# Patient Record
Sex: Female | Born: 1945 | Race: Asian | Hispanic: No
Health system: Southern US, Community
[De-identification: ages and names within clinical notes are randomized; demographics above are authoritative.]

---

## 2006-03-26 ENCOUNTER — Ambulatory Visit: Payer: Self-pay | Admitting: Family Medicine

## 2006-04-02 ENCOUNTER — Ambulatory Visit (HOSPITAL_COMMUNITY): Admission: RE | Admit: 2006-04-02 | Discharge: 2006-04-02 | Payer: Self-pay | Admitting: Family Medicine

## 2006-04-11 ENCOUNTER — Ambulatory Visit (HOSPITAL_COMMUNITY): Admission: RE | Admit: 2006-04-11 | Discharge: 2006-04-11 | Payer: Self-pay | Admitting: Internal Medicine

## 2006-04-21 ENCOUNTER — Encounter: Admission: RE | Admit: 2006-04-21 | Discharge: 2006-04-21 | Payer: Self-pay | Admitting: Internal Medicine

## 2006-07-21 ENCOUNTER — Ambulatory Visit: Payer: Self-pay | Admitting: Family Medicine

## 2006-07-22 ENCOUNTER — Ambulatory Visit: Payer: Self-pay | Admitting: *Deleted

## 2006-08-25 ENCOUNTER — Ambulatory Visit: Payer: Self-pay | Admitting: Family Medicine

## 2006-09-01 ENCOUNTER — Ambulatory Visit: Payer: Self-pay | Admitting: Family Medicine

## 2006-09-05 ENCOUNTER — Ambulatory Visit: Payer: Self-pay | Admitting: Family Medicine

## 2006-09-08 ENCOUNTER — Ambulatory Visit: Payer: Self-pay | Admitting: Nurse Practitioner

## 2006-09-29 ENCOUNTER — Ambulatory Visit: Payer: Self-pay | Admitting: Nurse Practitioner

## 2006-10-30 ENCOUNTER — Encounter: Admission: RE | Admit: 2006-10-30 | Discharge: 2006-10-30 | Payer: Self-pay | Admitting: Family Medicine

## 2006-11-11 ENCOUNTER — Ambulatory Visit: Payer: Self-pay | Admitting: Family Medicine

## 2007-04-08 ENCOUNTER — Ambulatory Visit: Payer: Self-pay | Admitting: Internal Medicine

## 2007-05-06 ENCOUNTER — Ambulatory Visit: Payer: Self-pay | Admitting: Internal Medicine

## 2007-05-07 ENCOUNTER — Encounter (INDEPENDENT_AMBULATORY_CARE_PROVIDER_SITE_OTHER): Payer: Self-pay | Admitting: Family Medicine

## 2007-05-07 DIAGNOSIS — R32 Unspecified urinary incontinence: Secondary | ICD-10-CM | POA: Insufficient documentation

## 2007-05-07 DIAGNOSIS — E785 Hyperlipidemia, unspecified: Secondary | ICD-10-CM

## 2007-07-22 ENCOUNTER — Encounter (INDEPENDENT_AMBULATORY_CARE_PROVIDER_SITE_OTHER): Payer: Self-pay | Admitting: *Deleted

## 2007-10-12 ENCOUNTER — Ambulatory Visit: Payer: Self-pay | Admitting: Internal Medicine

## 2007-10-12 ENCOUNTER — Encounter (INDEPENDENT_AMBULATORY_CARE_PROVIDER_SITE_OTHER): Payer: Self-pay | Admitting: Family Medicine

## 2007-10-12 LAB — CONVERTED CEMR LAB
AST: 26 units/L (ref 0–37)
Alkaline Phosphatase: 126 units/L — ABNORMAL HIGH (ref 39–117)
BUN: 10 mg/dL (ref 6–23)
CO2: 25 meq/L (ref 19–32)
Cholesterol: 158 mg/dL (ref 0–200)
Creatinine, Ser: 0.55 mg/dL (ref 0.40–1.20)
Glucose, Bld: 83 mg/dL (ref 70–99)
LDL Cholesterol: 76 mg/dL (ref 0–99)
Potassium: 4.5 meq/L (ref 3.5–5.3)
Sodium: 145 meq/L (ref 135–145)
Total Bilirubin: 0.4 mg/dL (ref 0.3–1.2)
Total CHOL/HDL Ratio: 3
VLDL: 29 mg/dL (ref 0–40)

## 2008-12-16 ENCOUNTER — Ambulatory Visit: Payer: Self-pay | Admitting: Internal Medicine

## 2009-03-13 ENCOUNTER — Other Ambulatory Visit: Admission: RE | Admit: 2009-03-13 | Discharge: 2009-03-13 | Payer: Self-pay | Admitting: Family Medicine

## 2009-03-14 ENCOUNTER — Encounter: Admission: RE | Admit: 2009-03-14 | Discharge: 2009-03-14 | Payer: Self-pay | Admitting: Family Medicine

## 2010-11-25 ENCOUNTER — Encounter: Payer: Self-pay | Admitting: Internal Medicine

## 2010-11-28 ENCOUNTER — Other Ambulatory Visit: Payer: Self-pay | Admitting: Internal Medicine

## 2010-11-28 DIAGNOSIS — Z780 Asymptomatic menopausal state: Secondary | ICD-10-CM

## 2010-11-28 DIAGNOSIS — Z1239 Encounter for other screening for malignant neoplasm of breast: Secondary | ICD-10-CM

## 2010-12-10 ENCOUNTER — Ambulatory Visit
Admission: RE | Admit: 2010-12-10 | Discharge: 2010-12-10 | Disposition: A | Discharge status: Home / Self Care | Payer: No Typology Code available for payment source | Source: Ambulatory Visit | Attending: Internal Medicine | Admitting: Internal Medicine

## 2010-12-10 DIAGNOSIS — Z780 Asymptomatic menopausal state: Secondary | ICD-10-CM

## 2010-12-10 DIAGNOSIS — Z1239 Encounter for other screening for malignant neoplasm of breast: Secondary | ICD-10-CM

## 2012-05-27 ENCOUNTER — Other Ambulatory Visit: Payer: Self-pay | Admitting: Family Medicine

## 2012-05-27 ENCOUNTER — Other Ambulatory Visit: Payer: Self-pay | Admitting: Internal Medicine

## 2012-05-27 DIAGNOSIS — Z1231 Encounter for screening mammogram for malignant neoplasm of breast: Secondary | ICD-10-CM

## 2012-06-15 ENCOUNTER — Ambulatory Visit
Admission: RE | Admit: 2012-06-15 | Discharge: 2012-06-15 | Disposition: A | Discharge status: Home / Self Care | Payer: Medicare Other | Source: Ambulatory Visit | Attending: Family Medicine | Admitting: Family Medicine

## 2012-06-15 DIAGNOSIS — Z1231 Encounter for screening mammogram for malignant neoplasm of breast: Secondary | ICD-10-CM

## 2012-09-24 ENCOUNTER — Other Ambulatory Visit: Payer: Self-pay | Admitting: Family Medicine

## 2012-09-24 DIAGNOSIS — Z780 Asymptomatic menopausal state: Secondary | ICD-10-CM

## 2012-09-24 DIAGNOSIS — Z1231 Encounter for screening mammogram for malignant neoplasm of breast: Secondary | ICD-10-CM

## 2014-12-19 ENCOUNTER — Other Ambulatory Visit: Payer: Self-pay

## 2015-05-25 ENCOUNTER — Other Ambulatory Visit: Payer: Self-pay | Admitting: Family Medicine

## 2015-05-25 DIAGNOSIS — Z1231 Encounter for screening mammogram for malignant neoplasm of breast: Secondary | ICD-10-CM

## 2015-05-25 DIAGNOSIS — M8580 Other specified disorders of bone density and structure, unspecified site: Secondary | ICD-10-CM

## 2015-05-30 ENCOUNTER — Ambulatory Visit
Admission: RE | Admit: 2015-05-30 | Discharge: 2015-05-30 | Disposition: A | Discharge status: Home / Self Care | Payer: Medicare Other | Source: Ambulatory Visit | Attending: Family Medicine | Admitting: Family Medicine

## 2015-05-30 DIAGNOSIS — Z1231 Encounter for screening mammogram for malignant neoplasm of breast: Secondary | ICD-10-CM

## 2016-06-25 ENCOUNTER — Ambulatory Visit (HOSPITAL_COMMUNITY)
Admission: EM | Admit: 2016-06-25 | Discharge: 2016-06-25 | Discharge status: Absent without leave | Payer: Medicare Other

## 2017-10-07 ENCOUNTER — Other Ambulatory Visit: Payer: Self-pay | Admitting: Family Medicine

## 2017-10-07 DIAGNOSIS — Z1231 Encounter for screening mammogram for malignant neoplasm of breast: Secondary | ICD-10-CM

## 2017-10-10 ENCOUNTER — Ambulatory Visit
Admission: RE | Admit: 2017-10-10 | Discharge: 2017-10-10 | Disposition: A | Discharge status: Home / Self Care | Payer: Medicare Other | Source: Ambulatory Visit | Attending: Family Medicine | Admitting: Family Medicine

## 2017-10-10 DIAGNOSIS — Z1231 Encounter for screening mammogram for malignant neoplasm of breast: Secondary | ICD-10-CM

## 2017-11-05 ENCOUNTER — Ambulatory Visit: Payer: Medicare Other

## 2018-12-15 ENCOUNTER — Other Ambulatory Visit: Payer: Self-pay | Admitting: Family Medicine

## 2018-12-15 DIAGNOSIS — M8580 Other specified disorders of bone density and structure, unspecified site: Secondary | ICD-10-CM

## 2018-12-15 DIAGNOSIS — Z1231 Encounter for screening mammogram for malignant neoplasm of breast: Secondary | ICD-10-CM

## 2019-02-16 ENCOUNTER — Ambulatory Visit: Payer: Medicare Other

## 2019-02-16 ENCOUNTER — Other Ambulatory Visit: Payer: Medicare Other

## 2019-05-18 ENCOUNTER — Other Ambulatory Visit: Payer: Self-pay

## 2019-05-18 ENCOUNTER — Ambulatory Visit
Admission: RE | Admit: 2019-05-18 | Discharge: 2019-05-18 | Disposition: A | Discharge status: Home / Self Care | Payer: Medicare Other | Source: Ambulatory Visit | Attending: Family Medicine | Admitting: Family Medicine

## 2019-05-18 DIAGNOSIS — M8580 Other specified disorders of bone density and structure, unspecified site: Secondary | ICD-10-CM

## 2019-05-18 DIAGNOSIS — Z1231 Encounter for screening mammogram for malignant neoplasm of breast: Secondary | ICD-10-CM

## 2020-12-15 DIAGNOSIS — H524 Presbyopia: Secondary | ICD-10-CM | POA: Diagnosis not present

## 2020-12-15 DIAGNOSIS — H04123 Dry eye syndrome of bilateral lacrimal glands: Secondary | ICD-10-CM | POA: Diagnosis not present

## 2020-12-15 DIAGNOSIS — H2513 Age-related nuclear cataract, bilateral: Secondary | ICD-10-CM | POA: Diagnosis not present

## 2020-12-19 ENCOUNTER — Other Ambulatory Visit: Payer: Self-pay | Admitting: Internal Medicine

## 2020-12-19 DIAGNOSIS — K2900 Acute gastritis without bleeding: Secondary | ICD-10-CM | POA: Diagnosis not present

## 2020-12-19 DIAGNOSIS — E559 Vitamin D deficiency, unspecified: Secondary | ICD-10-CM | POA: Diagnosis not present

## 2020-12-19 DIAGNOSIS — Z79899 Other long term (current) drug therapy: Secondary | ICD-10-CM | POA: Diagnosis not present

## 2020-12-19 DIAGNOSIS — Z0000 Encounter for general adult medical examination without abnormal findings: Secondary | ICD-10-CM | POA: Diagnosis not present

## 2020-12-19 DIAGNOSIS — Z7189 Other specified counseling: Secondary | ICD-10-CM | POA: Diagnosis not present

## 2020-12-19 DIAGNOSIS — Z1231 Encounter for screening mammogram for malignant neoplasm of breast: Secondary | ICD-10-CM

## 2020-12-19 DIAGNOSIS — M8580 Other specified disorders of bone density and structure, unspecified site: Secondary | ICD-10-CM

## 2020-12-19 DIAGNOSIS — E785 Hyperlipidemia, unspecified: Secondary | ICD-10-CM | POA: Diagnosis not present

## 2020-12-19 DIAGNOSIS — Z1389 Encounter for screening for other disorder: Secondary | ICD-10-CM | POA: Diagnosis not present

## 2021-05-21 ENCOUNTER — Ambulatory Visit
Admission: RE | Admit: 2021-05-21 | Discharge: 2021-05-21 | Disposition: A | Discharge status: Home / Self Care | Payer: Medicare Other | Source: Ambulatory Visit | Attending: Internal Medicine | Admitting: Internal Medicine

## 2021-05-21 ENCOUNTER — Other Ambulatory Visit: Payer: Self-pay

## 2021-05-21 DIAGNOSIS — M8588 Other specified disorders of bone density and structure, other site: Secondary | ICD-10-CM | POA: Diagnosis not present

## 2021-05-21 DIAGNOSIS — Z780 Asymptomatic menopausal state: Secondary | ICD-10-CM | POA: Diagnosis not present

## 2021-05-21 DIAGNOSIS — Z1231 Encounter for screening mammogram for malignant neoplasm of breast: Secondary | ICD-10-CM

## 2021-05-21 DIAGNOSIS — M8580 Other specified disorders of bone density and structure, unspecified site: Secondary | ICD-10-CM

## 2021-05-21 DIAGNOSIS — M810 Age-related osteoporosis without current pathological fracture: Secondary | ICD-10-CM | POA: Diagnosis not present

## 2021-05-24 DIAGNOSIS — M810 Age-related osteoporosis without current pathological fracture: Secondary | ICD-10-CM | POA: Diagnosis not present

## 2021-05-24 DIAGNOSIS — Z0000 Encounter for general adult medical examination without abnormal findings: Secondary | ICD-10-CM | POA: Diagnosis not present

## 2021-12-18 DIAGNOSIS — H524 Presbyopia: Secondary | ICD-10-CM | POA: Diagnosis not present

## 2021-12-18 DIAGNOSIS — H2513 Age-related nuclear cataract, bilateral: Secondary | ICD-10-CM | POA: Diagnosis not present

## 2021-12-26 DIAGNOSIS — M21612 Bunion of left foot: Secondary | ICD-10-CM | POA: Diagnosis not present

## 2021-12-26 DIAGNOSIS — Z79899 Other long term (current) drug therapy: Secondary | ICD-10-CM | POA: Diagnosis not present

## 2021-12-26 DIAGNOSIS — Z1389 Encounter for screening for other disorder: Secondary | ICD-10-CM | POA: Diagnosis not present

## 2021-12-26 DIAGNOSIS — M810 Age-related osteoporosis without current pathological fracture: Secondary | ICD-10-CM | POA: Diagnosis not present

## 2021-12-26 DIAGNOSIS — Z0000 Encounter for general adult medical examination without abnormal findings: Secondary | ICD-10-CM | POA: Diagnosis not present

## 2021-12-26 DIAGNOSIS — N3281 Overactive bladder: Secondary | ICD-10-CM | POA: Diagnosis not present

## 2021-12-26 DIAGNOSIS — E559 Vitamin D deficiency, unspecified: Secondary | ICD-10-CM | POA: Diagnosis not present

## 2021-12-26 DIAGNOSIS — E785 Hyperlipidemia, unspecified: Secondary | ICD-10-CM | POA: Diagnosis not present

## 2022-02-01 DIAGNOSIS — R739 Hyperglycemia, unspecified: Secondary | ICD-10-CM | POA: Diagnosis not present

## 2022-02-01 DIAGNOSIS — N3281 Overactive bladder: Secondary | ICD-10-CM | POA: Diagnosis not present

## 2022-02-01 DIAGNOSIS — R7309 Other abnormal glucose: Secondary | ICD-10-CM | POA: Diagnosis not present

## 2022-04-04 DIAGNOSIS — E441 Mild protein-calorie malnutrition: Secondary | ICD-10-CM | POA: Diagnosis not present

## 2022-04-04 DIAGNOSIS — N3281 Overactive bladder: Secondary | ICD-10-CM | POA: Diagnosis not present

## 2022-04-04 DIAGNOSIS — R634 Abnormal weight loss: Secondary | ICD-10-CM | POA: Diagnosis not present

## 2022-04-04 DIAGNOSIS — H04123 Dry eye syndrome of bilateral lacrimal glands: Secondary | ICD-10-CM | POA: Diagnosis not present

## 2022-05-09 ENCOUNTER — Other Ambulatory Visit: Payer: Self-pay | Admitting: Internal Medicine

## 2022-05-09 DIAGNOSIS — Z1231 Encounter for screening mammogram for malignant neoplasm of breast: Secondary | ICD-10-CM

## 2022-05-21 ENCOUNTER — Ambulatory Visit: Payer: Medicare Other

## 2022-05-22 ENCOUNTER — Ambulatory Visit: Payer: Medicare Other

## 2022-05-23 ENCOUNTER — Ambulatory Visit
Admission: RE | Admit: 2022-05-23 | Discharge: 2022-05-23 | Disposition: A | Discharge status: Home / Self Care | Payer: Medicare Other | Source: Ambulatory Visit | Attending: Internal Medicine | Admitting: Internal Medicine

## 2022-05-23 DIAGNOSIS — Z1231 Encounter for screening mammogram for malignant neoplasm of breast: Secondary | ICD-10-CM

## 2022-06-26 DIAGNOSIS — M810 Age-related osteoporosis without current pathological fracture: Secondary | ICD-10-CM | POA: Diagnosis not present

## 2022-06-26 DIAGNOSIS — N3281 Overactive bladder: Secondary | ICD-10-CM | POA: Diagnosis not present

## 2022-06-26 DIAGNOSIS — E559 Vitamin D deficiency, unspecified: Secondary | ICD-10-CM | POA: Diagnosis not present

## 2022-06-26 DIAGNOSIS — E441 Mild protein-calorie malnutrition: Secondary | ICD-10-CM | POA: Diagnosis not present

## 2022-11-08 ENCOUNTER — Ambulatory Visit: Payer: Medicare Other | Admitting: Orthopedic Surgery

## 2022-11-08 ENCOUNTER — Ambulatory Visit (INDEPENDENT_AMBULATORY_CARE_PROVIDER_SITE_OTHER): Payer: Medicare Other

## 2022-11-08 VITALS — BP 123/74 | HR 59 | Ht 59.0 in | Wt 90.0 lb

## 2022-11-08 DIAGNOSIS — M545 Low back pain, unspecified: Secondary | ICD-10-CM

## 2022-11-08 NOTE — Progress Notes (Signed)
Orthopedic Spine Surgery Office Note  Assessment: Patient is a 77 y.o. female with right sided low back pain, possible SI joint as etiology versus degenerative disc disease   Plan: -Explained that initially conservative treatment is tried as a significant number of patients may experience relief with these treatment modalities. Discussed that the conservative treatments include:  -activity modification  -physical therapy  -over the counter pain medications  -medrol dosepak  -steroid injections -Patient has tried activity modification  -Recommended PT (referral provided), home exercises, and tylenol (650mg  TID) -Patient should return to office in 6 weeks, x-rays at next visit: AP pelvis/inlet/outlet   Patient expressed understanding of the plan and all questions were answered to the patient's satisfaction.   ___________________________________________________________________________   History:  Patient is a 77 y.o. female who presents today for lumbar spine. Patient had right sided low back pain about 15 years ago that is very similar to what she is experiencing now. She said she received one steroid injection at that time and the pain went away. Pain returned about 3 weeks ago. There was no trauma or injury that brought on the pain. Pain is felt over the right lower back near the SI joint. It is worse with she is standing for awhile or walking. Gets better if she rests. She has tried modifying her activities and resting but it has not gotten better. She not tried any other treatments. No pain radiating down the legs. No paresthesias or numbness.    Weakness: denies Symptoms of imbalance: denies Paresthesias and numbness: denies Bowel or bladder incontinence: denies Saddle anesthesia: denies  Treatments tried: activity modification  Review of systems: Denies fevers and chills, night sweats, unexplained weight loss, history of cancer, pain that wakes them at night  Past medical  history: HLD  Allergies: NKDA  Past surgical history:  None  Social history: Denies use of nicotine product (smoking, vaping, patches, smokeless) Alcohol use: denies Denies recreational drug use   Physical Exam:  General: no acute distress, appears stated age Neurologic: alert, answering questions appropriately, following commands Respiratory: unlabored breathing on room air, symmetric chest rise Psychiatric: appropriate affect, normal cadence to speech   MSK (spine):  -Strength exam      Left  Right EHL    5/5  5/5 TA    5/5  5/5 GSC    5/5  5/5 Knee extension  5/5  5/5 Hip flexion   5/5  5/5  -Sensory exam    Sensation intact to light touch in L3-S1 nerve distributions of bilateral lower extremities  -Achilles DTR: 1/4 on the left, 1/4 on the right -Patellar tendon DTR: 1/4 on the left, 1/4 on the right  -Straight leg raise: negative -Contralateral straight leg raise: negative -Clonus: no beats bilaterally  -Left hip exam: no pain through range of motion, negative stinchfield, negative FABER -Right hip exam: no pain through range of motion, negative FABER, negative stinchfield, mild pain with SI joint compression and gaenslen's TTP over the SI joint area  Imaging: XR of the lumbar spine from 11/08/2022 was independently reviewed and interpreted, showing lumbar scoliosis with apex to the right at L3. Disc height loss at L3/4, L4/5, L5/S1. No fracture or dislocation.   Patient name: Gabrielle Beltran Patient MRN: [id number] Date of visit: 11/08/22

## 2022-11-18 ENCOUNTER — Ambulatory Visit: Payer: Medicare Other | Admitting: Physical Therapy

## 2022-11-18 ENCOUNTER — Encounter: Payer: Self-pay | Admitting: Physical Therapy

## 2022-11-18 DIAGNOSIS — R293 Abnormal posture: Secondary | ICD-10-CM

## 2022-11-18 DIAGNOSIS — M6281 Muscle weakness (generalized): Secondary | ICD-10-CM

## 2022-11-18 DIAGNOSIS — M5459 Other low back pain: Secondary | ICD-10-CM

## 2022-11-18 NOTE — Therapy (Signed)
OUTPATIENT PHYSICAL THERAPY THORACOLUMBAR EVALUATION   Patient Name: Gabrielle Beltran MRN: [id number] DOB:11-23-1945, 77 y.o., female Today's Date: 11/18/2022  END OF SESSION:  PT End of Session - 11/18/22 0919     Visit Number 1    Number of Visits 13    Date for PT Re-Evaluation 12/30/22    Authorization Type [employer] MCR    Authorization Time Period 11/18/22 to 01/13/23    Progress Note Due on Visit 10    PT Start Time 0909    PT Stop Time 0949    PT Time Calculation (min) 40 min    Activity Tolerance Patient tolerated treatment well    Behavior During Therapy [facility] for tasks assessed/performed             History reviewed. No pertinent past medical history. History reviewed. No pertinent surgical history. Patient Active Problem List   Diagnosis Date Noted   HYPERLIPIDEMIA 05/07/2007   URINARY INCONTINENCE 05/07/2007    PCP: Kelton Pillar MD   REFERRING PROVIDER: Callie Fielding, MD  REFERRING DIAG: M54.50 (ICD-10-CM) - Low back pain, unspecified back pain laterality, unspecified chronicity, unspecified whether sciatica present  Rationale for Evaluation and Treatment: Rehabilitation  THERAPY DIAG:  Other low back pain  Muscle weakness (generalized)  Abnormal posture  ONSET DATE: 11/08/2022  SUBJECTIVE:                                                                                                                                                                                           SUBJECTIVE STATEMENT:    I've had this pain for a long time, had a lot of pain 15-20 years ago but I took steroid shots for pain management and it was OK. Now the pain is in the same spot, its uncomfortable now. No falls or anything, no injuries recently. Started back about 2 weeks ago, I've been trying creams and pain patches, Tylenol too.   PERTINENT HISTORY:  Orthopedic Spine Surgery Office Note   Assessment: Patient is a 77 y.o. female with right sided low back pain, possible  SI joint as etiology versus degenerative disc disease     Plan: -Explained that initially conservative treatment is tried as a significant number of patients may experience relief with these treatment modalities. Discussed that the conservative treatments include:             -activity modification             -physical therapy             -over the counter pain medications             -  medrol dosepak             -steroid injections -Patient has tried activity modification  -Recommended PT (referral provided), home exercises, and tylenol (650mg  TID) -Patient should return to office in 6 weeks, x-rays at next visit: AP pelvis/inlet/outlet     Patient expressed understanding of the plan and all questions were answered to the patient's satisfaction.    ___________________________________________________________________________     History:   Patient is a 77 y.o. female who presents today for lumbar spine. Patient had right sided low back pain about 15 years ago that is very similar to what she is experiencing now. She said she received one steroid injection at that time and the pain went away. Pain returned about 3 weeks ago. There was no trauma or injury that brought on the pain. Pain is felt over the right lower back near the SI joint. It is worse with she is standing for awhile or walking. Gets better if she rests. She has tried modifying her activities and resting but it has not gotten better. She not tried any other treatments. No pain radiating down the legs. No paresthesias or numbness.       PAIN:  Are you having pain? Yes: NPRS scale: "just uncomfortable today" /10 Pain location: R low back but sometimes L and both sides but R>L Pain description: Uncomfortable Aggravating factors: sitting, slouching  Relieving factors: standing   PRECAUTIONS: None  WEIGHT BEARING RESTRICTIONS: No  FALLS:  Has patient fallen in last 6 months? No  LIVING ENVIRONMENT: Lives with: lives  with their family Lives in: House/apartment Stairs: no steps, ranch style house Has following equipment at home: None  OCCUPATION: home maker  PLOF: Independent, Independent with basic ADLs, Independent with gait, and Independent with transfers  PATIENT GOALS: get rid of pain  NEXT MD VISIT: Dr. 70 12/20/22   OBJECTIVE:   DIAGNOSTIC FINDINGS:    PATIENT SURVEYS:  FOTO 57, predicted 72  SCREENING FOR RED FLAGS: Bowel or bladder incontinence: No Spinal tumors: No Cauda equina syndrome: No Compression fracture: No Abdominal aneurysm: No  COGNITION: Overall cognitive status: Within functional limits for tasks assessed     SENSATION: WFL  MUSCLE LENGTH:  HS mild tightness B Piriformis WNL B   POSTURE: rounded shoulders and forward head  PALPATION: Lumbar parapsinals tight but not TTP  LUMBAR ROM:   AROM eval  Flexion WNL; RFIS small increase in pain    Extension Mild limitation, REIS improvement in pain   Right lateral flexion Mild limitation  Left lateral flexion Moderate limitation   Right rotation Mild limitation   Left rotation Mild limitation    (Blank rows = not tested)  LOWER EXTREMITY ROM:     Active  Right eval Left eval  Hip flexion    Hip extension    Hip abduction    Hip adduction    Hip internal rotation    Hip external rotation    Knee flexion    Knee extension    Ankle dorsiflexion    Ankle plantarflexion    Ankle inversion    Ankle eversion     (Blank rows = not tested)  LOWER EXTREMITY MMT:    MMT Right eval Left eval  Hip flexion 3 3  Hip extension 3 3  Hip abduction 3 3  Hip adduction    Hip internal rotation    Hip external rotation    Knee flexion 4 4-  Knee extension 5 4  Ankle  dorsiflexion 5 5  Ankle plantarflexion    Ankle inversion    Ankle eversion     (Blank rows = not tested)  LUMBAR SPECIAL TESTS:  Straight leg raise test: Negative  FUNCTIONAL TESTS:   R LE slightly shorter than R by about 1/4  inch   GAIT: Distance walked: in clinic distances  Assistive device utilized: None Level of assistance: Complete Independence Comments: + trendelenburg   TODAY'S TREATMENT:                                                                                                                              DATE:   Eval   Objective measures/appropriate education  TherEx  Bridges x10 Lumbar rotation stretch x10 Tried prone press ups, limited by UE strength Standing lumbar extensions x10  Hip hikes x10 B     PATIENT EDUCATION:  Education details: exam findings, POC, HEP  Person educated: Patient Education method: Customer service manager Education comprehension: verbalized understanding and returned demonstration  HOME EXERCISE PROGRAM:   Access Code: [email or web address] URL: [email or web address] Date: 11/18/2022 Prepared by: Deniece Ree  Exercises - Supine Bridge  - 2 x daily - 7 x weekly - 1 sets - 10 reps - 2 hold - Supine Lower Trunk Rotation  - 2 x daily - 7 x weekly - 1 sets - 10 reps - 5 hold - Standing Lumbar Extension  - 2 x daily - 7 x weekly - 1 sets - 10 reps - 1 hold - Standing Hip Hiking  - 2 x daily - 7 x weekly - 1 sets - 10 reps - 2 hold  ASSESSMENT:  CLINICAL IMPRESSION: Patient is a 77 y.o. F who was seen today for physical therapy evaluation and treatment for low back pain. Exam is fairly straight forward and reveals postural impairment, generalized weakness, limited lumbar mobility with mm spasm, poor biomechanics, and impaired mm flexibility. Seems to have an extension preference as well. Will benefit from skilled PT services to address pain and assist in return to optimal level of function.   OBJECTIVE IMPAIRMENTS: decreased ROM, decreased strength, increased fascial restrictions, increased muscle spasms, impaired flexibility, postural dysfunction, and pain.   ACTIVITY LIMITATIONS: bending, sitting, squatting, transfers, and locomotion  level  PARTICIPATION LIMITATIONS: meal prep, cleaning, laundry, driving, shopping, community activity, and yard work  PERSONAL FACTORS: Age, Behavior pattern, Fitness, Past/current experiences, Social background, and Time since onset of injury/illness/exacerbation are also affecting patient's functional outcome.   REHAB POTENTIAL: Good  CLINICAL DECISION MAKING: Stable/uncomplicated  EVALUATION COMPLEXITY: Low   GOALS: Goals reviewed with patient? Yes  SHORT TERM GOALS: Target date: 12/09/2022    Will be compliant with appropriate progressive HEP  Baseline: Goal status: INITIAL  2.  Will demonstrate better awareness of general posture as well as correct biomechanics for supine<->sit transfers and floor to waist heigh lifting  Baseline:  Goal status: INITIAL  3.  Lumbar ROM to have normalized with no limitations  Baseline:  Goal status: INITIAL  4.  Flexibility impairments to have resolved  Baseline:  Goal status: INITIAL   LONG TERM GOALS: Target date: 12/30/2022    MMT to have improved by at least 1 grade in all weak muscles  Baseline:  Goal status: INITIAL  2.  Pain to be no more than 1/10 at worst/frequency of pain to have improved by at least 75%  Baseline:  Goal status: INITIAL  3.  FOTO score to have improved by at least 10 points to show subjective functional improvement  Baseline:  Goal status: INITIAL  4.  Will be compliant in appropriate gym based exercise program to maintain functional gains from PT/prevent recurrence of pain  Baseline:  Goal status: INITIAL   PLAN:  PT FREQUENCY: 2x/week  PT DURATION: 6 weeks  PLANNED INTERVENTIONS: Therapeutic exercises, Therapeutic activity, Neuromuscular re-education, Balance training, Gait training, Patient/Family education, Self Care, Joint mobilization, Aquatic Therapy, Dry Needling, Electrical stimulation, Spinal mobilization, Cryotherapy, Moist heat, Taping, Ultrasound, Ionotophoresis 4mg /ml  Dexamethasone, Manual therapy, and Re-evaluation.  PLAN FOR NEXT SESSION: hip and core strength, focus on extension based program, biomechanics  PT DPT PN2

## 2022-11-20 ENCOUNTER — Ambulatory Visit: Payer: Medicare Other | Admitting: Physical Therapy

## 2022-11-20 ENCOUNTER — Encounter: Payer: Self-pay | Admitting: Physical Therapy

## 2022-11-20 DIAGNOSIS — R293 Abnormal posture: Secondary | ICD-10-CM

## 2022-11-20 DIAGNOSIS — M5459 Other low back pain: Secondary | ICD-10-CM

## 2022-11-20 DIAGNOSIS — M6281 Muscle weakness (generalized): Secondary | ICD-10-CM

## 2022-11-20 NOTE — Therapy (Signed)
OUTPATIENT PHYSICAL THERAPY TREATMENT NOTE   Patient Name: Gabrielle Beltran MRN: [id number] DOB:1946-08-17, 77 y.o., female Today's Date: 11/20/2022  END OF SESSION:   PT End of Session - 11/20/22 0841     Visit Number 2    Number of Visits 13    Date for PT Re-Evaluation 12/30/22    Authorization Type [employer] MCR    Authorization Time Period 11/18/22 to 01/13/23    Progress Note Due on Visit 10    PT Start Time 0845    PT Stop Time 0926    PT Time Calculation (min) 41 min    Activity Tolerance Patient tolerated treatment well    Behavior During Therapy [facility] for tasks assessed/performed             History reviewed. No pertinent past medical history. History reviewed. No pertinent surgical history. Patient Active Problem List   Diagnosis Date Noted   HYPERLIPIDEMIA 05/07/2007   URINARY INCONTINENCE 05/07/2007     THERAPY DIAG:  Other low back pain  Muscle weakness (generalized)  Abnormal posture   PCP: Maurice Small MD   REFERRING PROVIDER: London Sheer, MD  REFERRING DIAG: M54.50 (ICD-10-CM) - Low back pain, unspecified back pain laterality, unspecified chronicity, unspecified whether sciatica present  Rationale for Evaluation and Treatment: Rehabilitation  EVAL THERAPY DIAG:  Other low back pain  Muscle weakness (generalized)  Abnormal posture  ONSET DATE: 11/08/2022  SUBJECTIVE:                                                                                                                                                                                           SUBJECTIVE STATEMENT: Rt side back is uncomfortable today; has done her exercises twice   PERTINENT HISTORY:  Orthopedic Spine Surgery Office Note   Assessment: Patient is a 77 y.o. female with right sided low back pain, possible SI joint as etiology versus degenerative disc disease     Plan: -Explained that initially conservative treatment is tried as a significant number of patients  may experience relief with these treatment modalities. Discussed that the conservative treatments include:             -activity modification             -physical therapy             -over the counter pain medications             -medrol dosepak             -steroid injections -Patient has tried activity modification  -Recommended PT (referral provided), home exercises, and tylenol (650mg  TID) -Patient  should return to office in 6 weeks, x-rays at next visit: AP pelvis/inlet/outlet     Patient expressed understanding of the plan and all questions were answered to the patient's satisfaction.    ___________________________________________________________________________     History:   Patient is a 77 y.o. female who presents today for lumbar spine. Patient had right sided low back pain about 15 years ago that is very similar to what she is experiencing now. She said she received one steroid injection at that time and the pain went away. Pain returned about 3 weeks ago. There was no trauma or injury that brought on the pain. Pain is felt over the right lower back near the SI joint. It is worse with she is standing for awhile or walking. Gets better if she rests. She has tried modifying her activities and resting but it has not gotten better. She not tried any other treatments. No pain radiating down the legs. No paresthesias or numbness.       PAIN:  Are you having pain? Yes: NPRS scale: "just uncomfortable today" 6-7/10 Pain location: R low back but sometimes L and both sides but R>L Pain description: Uncomfortable Aggravating factors: sitting, slouching  Relieving factors: standing   PRECAUTIONS: None  WEIGHT BEARING RESTRICTIONS: No  FALLS:  Has patient fallen in last 6 months? No  LIVING ENVIRONMENT: Lives with: lives with their family Lives in: House/apartment Stairs: no steps, ranch style house Has following equipment at home: None  OCCUPATION: home maker  PLOF:  Independent, Independent with basic ADLs, Independent with gait, and Independent with transfers  PATIENT GOALS: get rid of pain  NEXT MD VISIT: Dr. Laurance Flatten 12/20/22   OBJECTIVE:   PATIENT SURVEYS:  EVAL: FOTO 57, predicted 72  MUSCLE LENGTH: EVAL: HS mild tightness B Piriformis WNL B   POSTURE: rounded shoulders and forward head  PALPATION: EVAL: Lumbar parapsinals tight but not TTP  LUMBAR ROM:   AROM eval  Flexion WNL; RFIS small increase in pain    Extension Mild limitation, REIS improvement in pain   Right lateral flexion Mild limitation  Left lateral flexion Moderate limitation   Right rotation Mild limitation   Left rotation Mild limitation    (Blank rows = not tested)   LOWER EXTREMITY MMT:    MMT Right eval Left eval  Hip flexion 3 3  Hip extension 3 3  Hip abduction 3 3  Hip adduction    Hip internal rotation    Hip external rotation    Knee flexion 4 4-  Knee extension 5 4  Ankle dorsiflexion 5 5  Ankle plantarflexion    Ankle inversion    Ankle eversion     (Blank rows = not tested)  LUMBAR SPECIAL TESTS:  EVAL: Straight leg raise test: Negative  FUNCTIONAL TESTS:  EVAL: LLE slightly shorter than R by about 1/4 inch   GAIT: Distance walked: in clinic distances  Assistive device utilized: None Level of assistance: Complete Independence Comments: + trendelenburg   TODAY'S TREATMENT:  DATE:  11/20/22 TherEx NuStep L5 x 6 min Standing lumbar extension x10 reps; 1-2 sec hold Standing hip hike x 10 reps bil; 2 sec hold Bridges x 10 reps; 2 sec hold LTR x 10 reps bil; 5 sec hold Single limb clamshell in hooklying x10 reps bil; L4 band Single knee to chest 3x15 sec bil Supine piriformis stretch (knee to opp shoulder) 3x15 sec bil Prone hip extension x10 reps bil; 5 sec hold Scapular retraction 10 x 5 sec hold; L3  band   Eval   Objective measures/appropriate education  TherEx  Bridges x10 Lumbar rotation stretch x10 Tried prone press ups, limited by UE strength Standing lumbar extensions x10  Hip hikes x10 B     PATIENT EDUCATION:  Education details: exam findings, POC, HEP  Person educated: Patient Education method: Customer service manager Education comprehension: verbalized understanding and returned demonstration  HOME EXERCISE PROGRAM:   Access Code: [email or web address] URL: [email or web address] Date: 11/18/2022 Prepared by: Deniece Ree  Exercises - Supine Bridge  - 2 x daily - 7 x weekly - 1 sets - 10 reps - 2 hold - Supine Lower Trunk Rotation  - 2 x daily - 7 x weekly - 1 sets - 10 reps - 5 hold - Standing Lumbar Extension  - 2 x daily - 7 x weekly - 1 sets - 10 reps - 1 hold - Standing Hip Hiking  - 2 x daily - 7 x weekly - 1 sets - 10 reps - 2 hold  ASSESSMENT:  CLINICAL IMPRESSION: Session today focused on review of HEP with min cues needed; as well as continued strengthening.  Will continue to benefit from PT to maximize function.  OBJECTIVE IMPAIRMENTS: decreased ROM, decreased strength, increased fascial restrictions, increased muscle spasms, impaired flexibility, postural dysfunction, and pain.   ACTIVITY LIMITATIONS: bending, sitting, squatting, transfers, and locomotion level  PARTICIPATION LIMITATIONS: meal prep, cleaning, laundry, driving, shopping, community activity, and yard work  PERSONAL FACTORS: Age, Behavior pattern, Fitness, Past/current experiences, Social background, and Time since onset of injury/illness/exacerbation are also affecting patient's functional outcome.   REHAB POTENTIAL: Good  CLINICAL DECISION MAKING: Stable/uncomplicated  EVALUATION COMPLEXITY: Low   GOALS: Goals reviewed with patient? Yes  SHORT TERM GOALS: Target date: 12/09/2022    Will be compliant with appropriate progressive HEP  Baseline: Goal status:  INITIAL  2.  Will demonstrate better awareness of general posture as well as correct biomechanics for supine<->sit transfers and floor to waist heigh lifting  Baseline:  Goal status: INITIAL  3.  Lumbar ROM to have normalized with no limitations  Baseline:  Goal status: INITIAL  4.  Flexibility impairments to have resolved  Baseline:  Goal status: INITIAL   LONG TERM GOALS: Target date: 12/30/2022    MMT to have improved by at least 1 grade in all weak muscles  Baseline:  Goal status: INITIAL  2.  Pain to be no more than 1/10 at worst/frequency of pain to have improved by at least 75%  Baseline:  Goal status: INITIAL  3.  FOTO score to have improved by at least 10 points to show subjective functional improvement  Baseline:  Goal status: INITIAL  4.  Will be compliant in appropriate gym based exercise program to maintain functional gains from PT/prevent recurrence of pain  Baseline:  Goal status: INITIAL   PLAN:  PT FREQUENCY: 2x/week  PT DURATION: 6 weeks  PLANNED INTERVENTIONS: Therapeutic exercises, Therapeutic activity, Neuromuscular re-education, Balance training, Gait training, Patient/Family education, Self Care, Joint  mobilization, Aquatic Therapy, Dry Needling, Electrical stimulation, Spinal mobilization, Cryotherapy, Moist heat, Taping, Ultrasound, Ionotophoresis 4mg /ml Dexamethasone, Manual therapy, and Re-evaluation.  PLAN FOR NEXT SESSION: hip and core strength, focus on extension based program,   , PT, DPT 11/20/22 9:29 AM

## 2022-11-25 ENCOUNTER — Encounter: Payer: Self-pay | Admitting: Physical Therapy

## 2022-11-25 ENCOUNTER — Ambulatory Visit: Payer: Medicare Other | Admitting: Physical Therapy

## 2022-11-25 DIAGNOSIS — M6281 Muscle weakness (generalized): Secondary | ICD-10-CM | POA: Diagnosis not present

## 2022-11-25 DIAGNOSIS — R293 Abnormal posture: Secondary | ICD-10-CM

## 2022-11-25 DIAGNOSIS — M5459 Other low back pain: Secondary | ICD-10-CM | POA: Diagnosis not present

## 2022-11-25 NOTE — Therapy (Signed)
OUTPATIENT PHYSICAL THERAPY TREATMENT NOTE   Patient Name: Gabrielle Beltran MRN: [id number] DOB:16-Feb-1946, 77 y.o., female Today's Date: 11/25/2022  END OF SESSION:   PT End of Session - 11/25/22 0849     Visit Number 3    Number of Visits 13    Date for PT Re-Evaluation 12/30/22    Authorization Type [employer] MCR    Authorization Time Period 11/18/22 to 01/13/23    Progress Note Due on Visit 10    PT Start Time 0845    PT Stop Time 0924    PT Time Calculation (min) 39 min    Activity Tolerance Patient tolerated treatment well    Behavior During Therapy [facility] for tasks assessed/performed              History reviewed. No pertinent past medical history. History reviewed. No pertinent surgical history. Patient Active Problem List   Diagnosis Date Noted   HYPERLIPIDEMIA 05/07/2007   URINARY INCONTINENCE 05/07/2007     THERAPY DIAG:  Other low back pain  Muscle weakness (generalized)  Abnormal posture   PCP: Maurice Small MD   REFERRING PROVIDER: London Sheer, MD  REFERRING DIAG: M54.50 (ICD-10-CM) - Low back pain, unspecified back pain laterality, unspecified chronicity, unspecified whether sciatica present  Rationale for Evaluation and Treatment: Rehabilitation  EVAL THERAPY DIAG:  Other low back pain  Muscle weakness (generalized)  Abnormal posture  ONSET DATE: 11/08/2022  SUBJECTIVE:                                                                                                                                                                                           SUBJECTIVE STATEMENT: Pain is the same, exercises don't seem to be help; pain relief cream is the only thing that helps  PERTINENT HISTORY:  Orthopedic Spine Surgery Office Note   Assessment: Patient is a 77 y.o. female with right sided low back pain, possible SI joint as etiology versus degenerative disc disease     Plan: -Explained that initially conservative treatment is tried as a  significant number of patients may experience relief with these treatment modalities. Discussed that the conservative treatments include:             -activity modification             -physical therapy             -over the counter pain medications             -medrol dosepak             -steroid injections -Patient has tried activity modification  -Recommended PT (referral  provided), home exercises, and tylenol (650mg  TID) -Patient should return to office in 6 weeks, x-rays at next visit: AP pelvis/inlet/outlet     History:   Patient is a 77 y.o. female who presents today for lumbar spine. Patient had right sided low back pain about 15 years ago that is very similar to what she is experiencing now. She said she received one steroid injection at that time and the pain went away. Pain returned about 3 weeks ago. There was no trauma or injury that brought on the pain. Pain is felt over the right lower back near the SI joint. It is worse with she is standing for awhile or walking. Gets better if she rests. She has tried modifying her activities and resting but it has not gotten better. She not tried any other treatments. No pain radiating down the legs. No paresthesias or numbness.       PAIN:  Are you having pain? Yes: NPRS scale: "just uncomfortable today" 6-7/10 Pain location: R low back but sometimes L and both sides but R>L Pain description: Uncomfortable Aggravating factors: sitting, slouching  Relieving factors: standing   PRECAUTIONS: None  WEIGHT BEARING RESTRICTIONS: No  FALLS:  Has patient fallen in last 6 months? No  LIVING ENVIRONMENT: Lives with: lives with their family Lives in: House/apartment Stairs: no steps, ranch style house Has following equipment at home: None  OCCUPATION: home maker  PLOF: Independent, Independent with basic ADLs, Independent with gait, and Independent with transfers  PATIENT GOALS: get rid of pain  NEXT MD VISIT: Dr. Laurance Flatten 12/20/22    OBJECTIVE:   PATIENT SURVEYS:  EVAL: FOTO 57, predicted 72  MUSCLE LENGTH: EVAL: HS mild tightness B Piriformis WNL B   POSTURE: rounded shoulders and forward head  PALPATION: EVAL: Lumbar parapsinals tight but not TTP  LUMBAR ROM:   AROM eval  Flexion WNL; RFIS small increase in pain    Extension Mild limitation, REIS improvement in pain   Right lateral flexion Mild limitation  Left lateral flexion Moderate limitation   Right rotation Mild limitation   Left rotation Mild limitation    (Blank rows = not tested)   LOWER EXTREMITY MMT:    MMT Right eval Left eval  Hip flexion 3 3  Hip extension 3 3  Hip abduction 3 3  Hip adduction    Hip internal rotation    Hip external rotation    Knee flexion 4 4-  Knee extension 5 4  Ankle dorsiflexion 5 5  Ankle plantarflexion    Ankle inversion    Ankle eversion     (Blank rows = not tested)  LUMBAR SPECIAL TESTS:  EVAL: Straight leg raise test: Negative  FUNCTIONAL TESTS:  EVAL: LLE slightly shorter than R by about 1/4 inch   GAIT: Distance walked: in clinic distances  Assistive device utilized: None Level of assistance: Complete Independence Comments: + trendelenburg   TODAY'S TREATMENT:  DATE:  11/25/22 TherEx NuStep L6 x 6 min Supine single knee to chest 3x15 sec bil Supine piriformis stretch (knee to opp shoulder) 3x15 sec bil Isometric hip abduction/er with belt 15 reps x 5 sec hold Bridges with strap x 15 reps; 5 sec hold   Modalities IFC to Rt low back/SIJ area x 15 min with supine exercises noted above  TherAct Trial of heel lift and insert; pt did not like either in current shoe but unable to get lift below insole.  Still recommended trial of these at home to see if it helps her pain.  Amb within clinic with both.  11/20/22 TherEx NuStep L5 x 6 min Standing lumbar  extension x10 reps; 1-2 sec hold Standing hip hike x 10 reps bil; 2 sec hold Bridges x 10 reps; 2 sec hold LTR x 10 reps bil; 5 sec hold Single limb clamshell in hooklying x10 reps bil; L4 band Single knee to chest 3x15 sec bil Supine piriformis stretch (knee to opp shoulder) 3x15 sec bil Prone hip extension x10 reps bil; 5 sec hold Scapular retraction 10 x 5 sec hold; L3 band   Eval   Objective measures/appropriate education  TherEx  Bridges x10 Lumbar rotation stretch x10 Tried prone press ups, limited by UE strength Standing lumbar extensions x10  Hip hikes x10 B     PATIENT EDUCATION:  Education details: exam findings, POC, HEP  Person educated: Patient Education method: Medical illustrator Education comprehension: verbalized understanding and returned demonstration  HOME EXERCISE PROGRAM:   Access Code: [email or web address] URL: [email or web address] Date: 11/18/2022 Prepared by: Nedra Hai  Exercises - Supine Bridge  - 2 x daily - 7 x weekly - 1 sets - 10 reps - 2 hold - Supine Lower Trunk Rotation  - 2 x daily - 7 x weekly - 1 sets - 10 reps - 5 hold - Standing Lumbar Extension  - 2 x daily - 7 x weekly - 1 sets - 10 reps - 1 hold - Standing Hip Hiking  - 2 x daily - 7 x weekly - 1 sets - 10 reps - 2 hold  ASSESSMENT:  CLINICAL IMPRESSION: Pt reports no pain after session today and hopefully IFC beneficial with exercise to help pain.  Will continue to benefit from PT to maximize function.  OBJECTIVE IMPAIRMENTS: decreased ROM, decreased strength, increased fascial restrictions, increased muscle spasms, impaired flexibility, postural dysfunction, and pain.   ACTIVITY LIMITATIONS: bending, sitting, squatting, transfers, and locomotion level  PARTICIPATION LIMITATIONS: meal prep, cleaning, laundry, driving, shopping, community activity, and yard work  PERSONAL FACTORS: Age, Behavior pattern, Fitness, Past/current experiences, Social  background, and Time since onset of injury/illness/exacerbation are also affecting patient's functional outcome.   REHAB POTENTIAL: Good  CLINICAL DECISION MAKING: Stable/uncomplicated  EVALUATION COMPLEXITY: Low   GOALS: Goals reviewed with patient? Yes  SHORT TERM GOALS: Target date: 12/09/2022    Will be compliant with appropriate progressive HEP  Baseline: Goal status: INITIAL  2.  Will demonstrate better awareness of general posture as well as correct biomechanics for supine<->sit transfers and floor to waist heigh lifting  Baseline:  Goal status: INITIAL  3.  Lumbar ROM to have normalized with no limitations  Baseline:  Goal status: INITIAL  4.  Flexibility impairments to have resolved  Baseline:  Goal status: INITIAL   LONG TERM GOALS: Target date: 12/30/2022    MMT to have improved by at least 1 grade in all weak muscles  Baseline:  Goal status: INITIAL  2.  Pain to be no more than 1/10 at worst/frequency of pain to have improved by at least 75%  Baseline:  Goal status: INITIAL  3.  FOTO score to have improved by at least 10 points to show subjective functional improvement  Baseline:  Goal status: INITIAL  4.  Will be compliant in appropriate gym based exercise program to maintain functional gains from PT/prevent recurrence of pain  Baseline:  Goal status: INITIAL   PLAN:  PT FREQUENCY: 2x/week  PT DURATION: 6 weeks  PLANNED INTERVENTIONS: Therapeutic exercises, Therapeutic activity, Neuromuscular re-education, Balance training, Gait training, Patient/Family education, Self Care, Joint mobilization, Aquatic Therapy, Dry Needling, Electrical stimulation, Spinal mobilization, Cryotherapy, Moist heat, Taping, Ultrasound, Ionotophoresis 4mg /ml Dexamethasone, Manual therapy, and Re-evaluation.  PLAN FOR NEXT SESSION: SI stab, assess response to IFC,  hip and core strength, focus on extension based program   Laureen Abrahams, PT, DPT 11/25/22 9:27  AM

## 2022-11-26 ENCOUNTER — Encounter: Payer: Medicare Other | Admitting: Physical Therapy

## 2022-11-28 ENCOUNTER — Ambulatory Visit: Payer: Medicare Other | Admitting: Physical Therapy

## 2022-11-28 ENCOUNTER — Encounter: Payer: Self-pay | Admitting: Physical Therapy

## 2022-11-28 DIAGNOSIS — M5459 Other low back pain: Secondary | ICD-10-CM

## 2022-11-28 DIAGNOSIS — R293 Abnormal posture: Secondary | ICD-10-CM | POA: Diagnosis not present

## 2022-11-28 DIAGNOSIS — M6281 Muscle weakness (generalized): Secondary | ICD-10-CM

## 2022-11-28 NOTE — Therapy (Signed)
OUTPATIENT PHYSICAL THERAPY TREATMENT NOTE   Patient Name: Unique Sillas MRN: [id number] DOB:[birth year], 77 y.o., female Today's Date: 11/28/2022  END OF SESSION:   PT End of Session - 11/28/22 0844     Visit Number 4    Number of Visits 13    Date for PT Re-Evaluation 12/30/22    Authorization Type [employer] MCR    Authorization Time Period 11/18/22 to 01/13/23    Progress Note Due on Visit 10    PT Start Time 0844    PT Stop Time 0927    PT Time Calculation (min) 43 min    Activity Tolerance Patient tolerated treatment well    Behavior During Therapy [facility] for tasks assessed/performed               History reviewed. No pertinent past medical history. History reviewed. No pertinent surgical history. Patient Active Problem List   Diagnosis Date Noted   HYPERLIPIDEMIA 05/07/2007   URINARY INCONTINENCE 05/07/2007     THERAPY DIAG:  Other low back pain  Muscle weakness (generalized)  Abnormal posture   PCP: Kelton Pillar MD   REFERRING PROVIDER: Callie Fielding, MD  REFERRING DIAG: M54.50 (ICD-10-CM) - Low back pain, unspecified back pain laterality, unspecified chronicity, unspecified whether sciatica present  Rationale for Evaluation and Treatment: Rehabilitation  EVAL THERAPY DIAG:  Other low back pain  Muscle weakness (generalized)  Abnormal posture  ONSET DATE: 11/08/2022  SUBJECTIVE:                                                                                                                                                                                           SUBJECTIVE STATEMENT: Reports pain is about the same, wearing the heel lift in her Lt shoe - isn't sure she's going to like it.  PERTINENT HISTORY:  Orthopedic Spine Surgery Office Note   Assessment: Patient is a 77 y.o. female with right sided low back pain, possible SI joint as etiology versus degenerative disc disease     Plan: -Explained that initially conservative treatment is  tried as a significant number of patients may experience relief with these treatment modalities. Discussed that the conservative treatments include:             -activity modification             -physical therapy             -over the counter pain medications             -medrol dosepak             -steroid injections -Patient has tried activity modification  -  Recommended PT (referral provided), home exercises, and tylenol (650mg  TID) -Patient should return to office in 6 weeks, x-rays at next visit: AP pelvis/inlet/outlet     History:   Patient is a 77 y.o. female who presents today for lumbar spine. Patient had right sided low back pain about 15 years ago that is very similar to what she is experiencing now. She said she received one steroid injection at that time and the pain went away. Pain returned about 3 weeks ago. There was no trauma or injury that brought on the pain. Pain is felt over the right lower back near the SI joint. It is worse with she is standing for awhile or walking. Gets better if she rests. She has tried modifying her activities and resting but it has not gotten better. She not tried any other treatments. No pain radiating down the legs. No paresthesias or numbness.       PAIN:  Are you having pain? Yes: NPRS scale: "just uncomfortable today" 6-7/10 Pain location: R low back but sometimes L and both sides but R>L Pain description: Uncomfortable Aggravating factors: sitting, slouching  Relieving factors: standing   PRECAUTIONS: None  WEIGHT BEARING RESTRICTIONS: No  FALLS:  Has patient fallen in last 6 months? No  LIVING ENVIRONMENT: Lives with: lives with their family Lives in: House/apartment Stairs: no steps, ranch style house Has following equipment at home: None  OCCUPATION: home maker  PLOF: Independent, Independent with basic ADLs, Independent with gait, and Independent with transfers  PATIENT GOALS: get rid of pain  NEXT MD VISIT: Dr. Laurance Flatten  12/20/22   OBJECTIVE:   PATIENT SURVEYS:  EVAL: FOTO 57, predicted 72  MUSCLE LENGTH: EVAL: HS mild tightness B Piriformis WNL B   POSTURE: rounded shoulders and forward head  PALPATION: EVAL: Lumbar parapsinals tight but not TTP  LUMBAR ROM:   AROM eval  Flexion WNL; RFIS small increase in pain    Extension Mild limitation, REIS improvement in pain   Right lateral flexion Mild limitation  Left lateral flexion Moderate limitation   Right rotation Mild limitation   Left rotation Mild limitation    (Blank rows = not tested)   LOWER EXTREMITY MMT:    MMT Right eval Left eval  Hip flexion 3 3  Hip extension 3 3  Hip abduction 3 3  Hip adduction    Hip internal rotation    Hip external rotation    Knee flexion 4 4-  Knee extension 5 4  Ankle dorsiflexion 5 5  Ankle plantarflexion    Ankle inversion    Ankle eversion     (Blank rows = not tested)  LUMBAR SPECIAL TESTS:  EVAL: Straight leg raise test: Negative  FUNCTIONAL TESTS:  EVAL: LLE slightly shorter than R by about 1/4 inch   GAIT: Distance walked: in clinic distances  Assistive device utilized: None Level of assistance: Complete Independence Comments: + trendelenburg   TODAY'S TREATMENT:  DATE:  11/28/22 TherEx NuStep L6 x 6 min Reassessment of leg length at pt's request; LLE approx 1/4" shorter than Rt Prone hip extension 2x10 bil Quadruped cat/cow 2x10 reps (weights in hands to decrease wrist irritation) Supine bridges 2x10 Standing hip abduction x 20 reps bil with L3 band Standing hip extension x 20 reps bil with L3 band   Modalities IFC to Rt low back/SIJ area x 15 min with mat level/standing exercises noted above  11/25/22 TherEx NuStep L6 x 6 min Supine single knee to chest 3x15 sec bil Supine piriformis stretch (knee to opp shoulder) 3x15 sec bil Isometric hip  abduction/er with belt 15 reps x 5 sec hold Bridges with strap x 15 reps; 5 sec hold   Modalities IFC to Rt low back/SIJ area x 15 min with supine exercises noted above  TherAct Trial of heel lift and insert; pt did not like either in current shoe but unable to get lift below insole.  Still recommended trial of these at home to see if it helps her pain.  Amb within clinic with both.  11/20/22 TherEx NuStep L5 x 6 min Standing lumbar extension x10 reps; 1-2 sec hold Standing hip hike x 10 reps bil; 2 sec hold Bridges x 10 reps; 2 sec hold LTR x 10 reps bil; 5 sec hold Single limb clamshell in hooklying x10 reps bil; L4 band Single knee to chest 3x15 sec bil Supine piriformis stretch (knee to opp shoulder) 3x15 sec bil Prone hip extension x10 reps bil; 5 sec hold Scapular retraction 10 x 5 sec hold; L3 band   Eval   Objective measures/appropriate education  TherEx  Bridges x10 Lumbar rotation stretch x10 Tried prone press ups, limited by UE strength Standing lumbar extensions x10  Hip hikes x10 B     PATIENT EDUCATION:  Education details: exam findings, POC, HEP, 11/28/22: DN hand out provided  Person educated: Patient Education method: Explanation and Demonstration Education comprehension: verbalized understanding and returned demonstration  HOME EXERCISE PROGRAM:   Access Code: [email or web address] URL: [email or web address] Date: 11/18/2022 Prepared by: Deniece Ree  Exercises - Supine Bridge  - 2 x daily - 7 x weekly - 1 sets - 10 reps - 2 hold - Supine Lower Trunk Rotation  - 2 x daily - 7 x weekly - 1 sets - 10 reps - 5 hold - Standing Lumbar Extension  - 2 x daily - 7 x weekly - 1 sets - 10 reps - 1 hold - Standing Hip Hiking  - 2 x daily - 7 x weekly - 1 sets - 10 reps - 2 hold  ASSESSMENT:  CLINICAL IMPRESSION: Pt tolerated session well today, may benefit from DN as she has some trigger points in her glute med.  Will continue to benefit from PT to  maximize function.  OBJECTIVE IMPAIRMENTS: decreased ROM, decreased strength, increased fascial restrictions, increased muscle spasms, impaired flexibility, postural dysfunction, and pain.   ACTIVITY LIMITATIONS: bending, sitting, squatting, transfers, and locomotion level  PARTICIPATION LIMITATIONS: meal prep, cleaning, laundry, driving, shopping, community activity, and yard work  PERSONAL FACTORS: Age, Behavior pattern, Fitness, Past/current experiences, Social background, and Time since onset of injury/illness/exacerbation are also affecting patient's functional outcome.   REHAB POTENTIAL: Good  CLINICAL DECISION MAKING: Stable/uncomplicated  EVALUATION COMPLEXITY: Low   GOALS: Goals reviewed with patient? Yes  SHORT TERM GOALS: Target date: 12/09/2022    Will be compliant with appropriate progressive HEP  Baseline: Goal status: INITIAL  2.  Will demonstrate better  awareness of general posture as well as correct biomechanics for supine<->sit transfers and floor to waist heigh lifting  Baseline:  Goal status: INITIAL  3.  Lumbar ROM to have normalized with no limitations  Baseline:  Goal status: INITIAL  4.  Flexibility impairments to have resolved  Baseline:  Goal status: INITIAL   LONG TERM GOALS: Target date: 12/30/2022    MMT to have improved by at least 1 grade in all weak muscles  Baseline:  Goal status: INITIAL  2.  Pain to be no more than 1/10 at worst/frequency of pain to have improved by at least 75%  Baseline:  Goal status: INITIAL  3.  FOTO score to have improved by at least 10 points to show subjective functional improvement  Baseline:  Goal status: INITIAL  4.  Will be compliant in appropriate gym based exercise program to maintain functional gains from PT/prevent recurrence of pain  Baseline:  Goal status: INITIAL   PLAN:  PT FREQUENCY: 2x/week  PT DURATION: 6 weeks  PLANNED INTERVENTIONS: Therapeutic exercises, Therapeutic activity,  Neuromuscular re-education, Balance training, Gait training, Patient/Family education, Self Care, Joint mobilization, Aquatic Therapy, Dry Needling, Electrical stimulation, Spinal mobilization, Cryotherapy, Moist heat, Taping, Ultrasound, Ionotophoresis 4mg /ml Dexamethasone, Manual therapy, and Re-evaluation.  PLAN FOR NEXT SESSION: look at STGs, SI stab, IFC PRN,  hip and core strength, focus on extension based program   , PT, DPT 11/28/22 9:29 AM

## 2022-12-03 ENCOUNTER — Encounter: Payer: Self-pay | Admitting: Physical Therapy

## 2022-12-03 ENCOUNTER — Ambulatory Visit: Payer: Medicare Other | Admitting: Physical Therapy

## 2022-12-03 DIAGNOSIS — R293 Abnormal posture: Secondary | ICD-10-CM | POA: Diagnosis not present

## 2022-12-03 DIAGNOSIS — M6281 Muscle weakness (generalized): Secondary | ICD-10-CM | POA: Diagnosis not present

## 2022-12-03 DIAGNOSIS — M5459 Other low back pain: Secondary | ICD-10-CM

## 2022-12-03 NOTE — Therapy (Signed)
OUTPATIENT PHYSICAL THERAPY TREATMENT NOTE   Patient Name: Gabrielle Beltran MRN: [id number] DOB:July 08, 1946, 77 y.o., female Today's Date: 12/03/2022  END OF SESSION:   PT End of Session - 12/03/22 0917     Visit Number 5    Number of Visits 13    Date for PT Re-Evaluation 12/30/22    Authorization Type [employer] MCR    Progress Note Due on Visit 10    PT Start Time 0846    PT Stop Time 0926    PT Time Calculation (min) 40 min    Activity Tolerance Patient tolerated treatment well    Behavior During Therapy [facility] for tasks assessed/performed               History reviewed. No pertinent past medical history. History reviewed. No pertinent surgical history. Patient Active Problem List   Diagnosis Date Noted   HYPERLIPIDEMIA 05/07/2007   URINARY INCONTINENCE 05/07/2007     THERAPY DIAG:  Other low back pain  Muscle weakness (generalized)  Abnormal posture   PCP: Kelton Pillar MD   REFERRING PROVIDER: Callie Fielding, MD  REFERRING DIAG: M54.50 (ICD-10-CM) - Low back pain, unspecified back pain laterality, unspecified chronicity, unspecified whether sciatica present  Rationale for Evaluation and Treatment: Rehabilitation  EVAL THERAPY DIAG:  Other low back pain  Muscle weakness (generalized)  Abnormal posture  ONSET DATE: 11/08/2022  SUBJECTIVE:                                                                                                                                                                                           SUBJECTIVE STATEMENT: Pt arriving today reporting 5/10 pain in her low back more on the right side.   PERTINENT HISTORY:  Orthopedic Spine Surgery Office Note   Assessment: Patient is a 77 y.o. female with right sided low back pain, possible SI joint as etiology versus degenerative disc disease     Plan: -Explained that initially conservative treatment is tried as a significant number of patients may experience relief with these  treatment modalities. Discussed that the conservative treatments include:             -activity modification             -physical therapy             -over the counter pain medications             -medrol dosepak             -steroid injections -Patient has tried activity modification  -Recommended PT (referral provided), home exercises, and tylenol (650mg  TID) -Patient should return to  office in 6 weeks, x-rays at next visit: AP pelvis/inlet/outlet     History:   Patient is a 77 y.o. female who presents today for lumbar spine. Patient had right sided low back pain about 15 years ago that is very similar to what she is experiencing now. She said she received one steroid injection at that time and the pain went away. Pain returned about 3 weeks ago. There was no trauma or injury that brought on the pain. Pain is felt over the right lower back near the SI joint. It is worse with she is standing for awhile or walking. Gets better if she rests. She has tried modifying her activities and resting but it has not gotten better. She not tried any other treatments. No pain radiating down the legs. No paresthesias or numbness.       PAIN:  Are you having pain? 5/10 pain in right sided low back Pain location: R low back but sometimes L and both sides but R>L Pain description: Uncomfortable Aggravating factors: sitting, slouching  Relieving factors: standing   PRECAUTIONS: None  WEIGHT BEARING RESTRICTIONS: No  FALLS:  Has patient fallen in last 6 months? No  LIVING ENVIRONMENT: Lives with: lives with their family Lives in: House/apartment Stairs: no steps, ranch style house Has following equipment at home: None  OCCUPATION: home maker  PLOF: Independent, Independent with basic ADLs, Independent with gait, and Independent with transfers  PATIENT GOALS: get rid of pain  NEXT MD VISIT: Dr. Christell Constant 12/20/22   OBJECTIVE:   PATIENT SURVEYS:  EVAL: FOTO 57, predicted 72  MUSCLE  LENGTH: EVAL: HS mild tightness B Piriformis WNL B   POSTURE: rounded shoulders and forward head  PALPATION: EVAL: Lumbar parapsinals tight but not TTP  LUMBAR ROM:   AROM eval 12/03/22  Flexion WNL; RFIS small increase in pain   WFL, still pain reported   Extension Mild limitation, REIS improvement in pain  Slight increase in pain with extension attempt  Right lateral flexion Mild limitation   Left lateral flexion Moderate limitation    Right rotation Mild limitation    Left rotation Mild limitation     (Blank rows = not tested)   LOWER EXTREMITY MMT:    MMT Right eval Left eval  Hip flexion 3 3  Hip extension 3 3  Hip abduction 3 3  Hip adduction    Hip internal rotation    Hip external rotation    Knee flexion 4 4-  Knee extension 5 4  Ankle dorsiflexion 5 5  Ankle plantarflexion    Ankle inversion    Ankle eversion     (Blank rows = not tested)  LUMBAR SPECIAL TESTS:  EVAL: Straight leg raise test: Negative  FUNCTIONAL TESTS:  EVAL: LLE slightly shorter than R by about 1/4 inch   GAIT: Distance walked: in clinic distances  Assistive device utilized: None Level of assistance: Complete Independence Comments: + trendelenburg   TODAY'S TREATMENT:  DATE:  12/03/22 TherEx Supine single knee to chest 3x15 sec bil Supine piriformis stretch (knee to opp shoulder) 3x15 sec bil Quadraped: child's pose x 4 holding 30 seconds Quadraped: cat/camel x 10 holding 5 seconds Standing hip abduction: x 15 each LE Standing hip extension: x 15 each LE Modalities IFC to Rt low back/SIJ area x 10 min with moist heat Manual: Skilled palpation and soft tissue mobs to lumbar paraspinals and Rt QL Trigger Point Dry-Needling  Treatment instructions: Expect mild to moderate muscle soreness. S/S of pneumothorax if dry needled over a lung field, and to seek  immediate medical attention should they occur. Patient verbalized understanding of these instructions and education.  Patient Consent Given: Yes Education handout provided: Yes Muscles treated: lumbar paraspinals on Rt Electrical stimulation performed: No Parameters: N/A Treatment response/outcome: twitch response noted    11/28/22 TherEx NuStep L6 x 6 min Reassessment of leg length at pt's request; LLE approx 1/4" shorter than Rt Prone hip extension 2x10 bil Quadruped cat/cow 2x10 reps (weights in hands to decrease wrist irritation) Supine bridges 2x10 Standing hip abduction x 20 reps bil with L3 band Standing hip extension x 20 reps bil with L3 band   Modalities IFC to Rt low back/SIJ area x 15 min with mat level/standing exercises noted above  11/25/22 TherEx NuStep L6 x 6 min Supine single knee to chest 3x15 sec bil Supine piriformis stretch (knee to opp shoulder) 3x15 sec bil Isometric hip abduction/er with belt 15 reps x 5 sec hold Bridges with strap x 15 reps; 5 sec hold   Modalities IFC to Rt low back/SIJ area x 15 min with supine exercises noted above  TherAct Trial of heel lift and insert; pt did not like either in current shoe but unable to get lift below insole.  Still recommended trial of these at home to see if it helps her pain.  Amb within clinic with both.  11/20/22 TherEx NuStep L5 x 6 min Standing lumbar extension x10 reps; 1-2 sec hold Standing hip hike x 10 reps bil; 2 sec hold Bridges x 10 reps; 2 sec hold LTR x 10 reps bil; 5 sec hold Single limb clamshell in hooklying x10 reps bil; L4 band Single knee to chest 3x15 sec bil Supine piriformis stretch (knee to opp shoulder) 3x15 sec bil Prone hip extension x10 reps bil; 5 sec hold Scapular retraction 10 x 5 sec hold; L3 band   Eval   Objective measures/appropriate education  TherEx  Bridges x10 Lumbar rotation stretch x10 Tried prone press ups, limited by UE strength Standing lumbar  extensions x10  Hip hikes x10 B     PATIENT EDUCATION:  Education details: exam findings, POC, HEP, 11/28/22: DN hand out provided  Person educated: Patient Education method: Explanation and Demonstration Education comprehension: verbalized understanding and returned demonstration  HOME EXERCISE PROGRAM:   Access Code: [email or web address] URL: [email or web address] Date: 11/18/2022 Prepared by: Deniece Ree  Exercises - Supine Bridge  - 2 x daily - 7 x weekly - 1 sets - 10 reps - 2 hold - Supine Lower Trunk Rotation  - 2 x daily - 7 x weekly - 1 sets - 10 reps - 5 hold - Standing Lumbar Extension  - 2 x daily - 7 x weekly - 1 sets - 10 reps - 1 hold - Standing Hip Hiking  - 2 x daily - 7 x weekly - 1 sets - 10 reps - 2 hold  ASSESSMENT:  CLINICAL IMPRESSION: Pt arriving today  reporting 5/10 pain in her low back. Pt agreeing to TPDN today and wanted to try. Pt with twitch response noted. Continue to progress as pt tolerates toward LTG's set.   OBJECTIVE IMPAIRMENTS: decreased ROM, decreased strength, increased fascial restrictions, increased muscle spasms, impaired flexibility, postural dysfunction, and pain.   ACTIVITY LIMITATIONS: bending, sitting, squatting, transfers, and locomotion level  PARTICIPATION LIMITATIONS: meal prep, cleaning, laundry, driving, shopping, community activity, and yard work  PERSONAL FACTORS: Age, Behavior pattern, Fitness, Past/current experiences, Social background, and Time since onset of injury/illness/exacerbation are also affecting patient's functional outcome.   REHAB POTENTIAL: Good  CLINICAL DECISION MAKING: Stable/uncomplicated  EVALUATION COMPLEXITY: Low   GOALS: Goals reviewed with patient? Yes  SHORT TERM GOALS: Target date: 12/09/2022    Will be compliant with appropriate progressive HEP  Baseline: Goal status: MET 12/03/22  2.  Will demonstrate better awareness of general posture as well as correct biomechanics for  supine<->sit transfers and floor to waist heigh lifting  Baseline:  Goal status: MET 12/03/22  3.  Lumbar ROM to have normalized with no limitations  Baseline:  Goal status: on-going 12/03/22  4.  Flexibility impairments to have resolved  Baseline:  Goal status: on-going 12/03/22   LONG TERM GOALS: Target date: 12/30/2022    MMT to have improved by at least 1 grade in all weak muscles  Baseline:  Goal status: INITIAL  2.  Pain to be no more than 1/10 at worst/frequency of pain to have improved by at least 75%  Baseline:  Goal status: INITIAL  3.  FOTO score to have improved by at least 10 points to show subjective functional improvement  Baseline:  Goal status: INITIAL  4.  Will be compliant in appropriate gym based exercise program to maintain functional gains from PT/prevent recurrence of pain  Baseline:  Goal status: INITIAL   PLAN:  PT FREQUENCY: 2x/week  PT DURATION: 6 weeks  PLANNED INTERVENTIONS: Therapeutic exercises, Therapeutic activity, Neuromuscular re-education, Balance training, Gait training, Patient/Family education, Self Care, Joint mobilization, Aquatic Therapy, Dry Needling, Electrical stimulation, Spinal mobilization, Cryotherapy, Moist heat, Taping, Ultrasound, Ionotophoresis 4mg /ml Dexamethasone, Manual therapy, and Re-evaluation.  PLAN FOR NEXT SESSION: look at STGs, SI stab, IFC PRN,  hip and core strength, focus on extension based program   Kearney Hard, PT, MPT 12/03/22 9:21 AM   12/03/22 9:21 AM

## 2022-12-06 ENCOUNTER — Ambulatory Visit: Payer: Medicare Other | Admitting: Physical Therapy

## 2022-12-06 ENCOUNTER — Encounter: Payer: Self-pay | Admitting: Physical Therapy

## 2022-12-06 DIAGNOSIS — R293 Abnormal posture: Secondary | ICD-10-CM | POA: Diagnosis not present

## 2022-12-06 DIAGNOSIS — M5459 Other low back pain: Secondary | ICD-10-CM | POA: Diagnosis not present

## 2022-12-06 DIAGNOSIS — M6281 Muscle weakness (generalized): Secondary | ICD-10-CM | POA: Diagnosis not present

## 2022-12-06 NOTE — Therapy (Signed)
OUTPATIENT PHYSICAL THERAPY TREATMENT NOTE   Patient Name: Gabrielle Beltran MRN: [id number] DOB:Jun 24, 1946, 77 y.o., female Today's Date: 12/06/2022  END OF SESSION:   PT End of Session - 12/06/22 0842     Visit Number 6    Number of Visits 13    Date for PT Re-Evaluation 12/30/22    Authorization Type [employer] MCR    Progress Note Due on Visit 10    PT Start Time 0843    PT Stop Time 0923    PT Time Calculation (min) 40 min    Activity Tolerance Patient tolerated treatment well    Behavior During Therapy [facility] for tasks assessed/performed                History reviewed. No pertinent past medical history. History reviewed. No pertinent surgical history. Patient Active Problem List   Diagnosis Date Noted   HYPERLIPIDEMIA 05/07/2007   URINARY INCONTINENCE 05/07/2007     THERAPY DIAG:  Other low back pain  Muscle weakness (generalized)  Abnormal posture   PCP: Kelton Pillar MD   REFERRING PROVIDER: Callie Fielding, MD  REFERRING DIAG: M54.50 (ICD-10-CM) - Low back pain, unspecified back pain laterality, unspecified chronicity, unspecified whether sciatica present  Rationale for Evaluation and Treatment: Rehabilitation  EVAL THERAPY DIAG:  Other low back pain  Muscle weakness (generalized)  Abnormal posture  ONSET DATE: 11/08/2022  SUBJECTIVE:                                                                                                                                                                                           SUBJECTIVE STATEMENT: "I feel much better, Anderson Malta did a massage and heat last time."  PERTINENT HISTORY:  Orthopedic Spine Surgery Office Note   Assessment: Patient is a 77 y.o. female with right sided low back pain, possible SI joint as etiology versus degenerative disc disease     Plan: -Explained that initially conservative treatment is tried as a significant number of patients may experience relief with these treatment  modalities. Discussed that the conservative treatments include:             -activity modification             -physical therapy             -over the counter pain medications             -medrol dosepak             -steroid injections -Patient has tried activity modification  -Recommended PT (referral provided), home exercises, and tylenol (650mg  TID) -Patient should return to office in 6  weeks, x-rays at next visit: AP pelvis/inlet/outlet     History:   Patient is a 77 y.o. female who presents today for lumbar spine. Patient had right sided low back pain about 15 years ago that is very similar to what she is experiencing now. She said she received one steroid injection at that time and the pain went away. Pain returned about 3 weeks ago. There was no trauma or injury that brought on the pain. Pain is felt over the right lower back near the SI joint. It is worse with she is standing for awhile or walking. Gets better if she rests. She has tried modifying her activities and resting but it has not gotten better. She not tried any other treatments. No pain radiating down the legs. No paresthesias or numbness.       PAIN:  Are you having pain? Currently 0/10 pain in right sided low back Pain location: R low back but sometimes L and both sides but R>L Pain description: Uncomfortable Aggravating factors: sitting, slouching  Relieving factors: standing   PRECAUTIONS: None  WEIGHT BEARING RESTRICTIONS: No  FALLS:  Has patient fallen in last 6 months? No  LIVING ENVIRONMENT: Lives with: lives with their family Lives in: House/apartment Stairs: no steps, ranch style house Has following equipment at home: None  OCCUPATION: home maker  PLOF: Independent, Independent with basic ADLs, Independent with gait, and Independent with transfers  PATIENT GOALS: get rid of pain  NEXT MD VISIT: Dr. Laurance Flatten 12/20/22   OBJECTIVE:   PATIENT SURVEYS:  EVAL: FOTO 57, predicted 72  MUSCLE  LENGTH: EVAL: HS mild tightness B Piriformis WNL B   POSTURE: rounded shoulders and forward head  PALPATION: EVAL: Lumbar parapsinals tight but not TTP  LUMBAR ROM:   AROM eval 12/03/22  Flexion WNL; RFIS small increase in pain   WFL, still pain reported   Extension Mild limitation, REIS improvement in pain  Slight increase in pain with extension attempt  Right lateral flexion Mild limitation   Left lateral flexion Moderate limitation    Right rotation Mild limitation    Left rotation Mild limitation     (Blank rows = not tested)   LOWER EXTREMITY MMT:    MMT Right eval Left eval  Hip flexion 3 3  Hip extension 3 3  Hip abduction 3 3  Hip adduction    Hip internal rotation    Hip external rotation    Knee flexion 4 4-  Knee extension 5 4  Ankle dorsiflexion 5 5  Ankle plantarflexion    Ankle inversion    Ankle eversion     (Blank rows = not tested)  LUMBAR SPECIAL TESTS:  EVAL: Straight leg raise test: Negative  FUNCTIONAL TESTS:  EVAL: LLE slightly shorter than R by about 1/4 inch   GAIT: Distance walked: in clinic distances  Assistive device utilized: None Level of assistance: Complete Independence Comments: + trendelenburg   TODAY'S TREATMENT:  DATE:  12/06/22 TherEx NuStep L6 x 6 min Standing hip extension 2x10 bil Standing hip abduction 2x10 bil With E-Stim: Supine single knee to chest 3x30 sec bil Supine piriformis stretch (knee to opp shoulder) 3x30 sec bil Bridges 2x10; 3 sec hold Hooklying clamshells L4 band 2x10  Modalities IFC to low back x 15 min with moist heat and exercises performed above - provided information on where to purchase TENS unit from home   12/03/22 TherEx Supine single knee to chest 3x15 sec bil Supine piriformis stretch (knee to opp shoulder) 3x15 sec bil Quadraped: child's pose x 4 holding 30  seconds Quadraped: cat/camel x 10 holding 5 seconds Standing hip abduction: x 15 each LE Standing hip extension: x 15 each LE Modalities IFC to Rt low back/SIJ area x 10 min with moist heat Manual: Skilled palpation and soft tissue mobs to lumbar paraspinals and Rt QL Trigger Point Dry-Needling  Treatment instructions: Expect mild to moderate muscle soreness. S/S of pneumothorax if dry needled over a lung field, and to seek immediate medical attention should they occur. Patient verbalized understanding of these instructions and education.  Patient Consent Given: Yes Education handout provided: Yes Muscles treated: lumbar paraspinals on Rt Electrical stimulation performed: No Parameters: N/A Treatment response/outcome: twitch response noted  11/28/22 TherEx NuStep L6 x 6 min Reassessment of leg length at pt's request; LLE approx 1/4" shorter than Rt Prone hip extension 2x10 bil Quadruped cat/cow 2x10 reps (weights in hands to decrease wrist irritation) Supine bridges 2x10 Standing hip abduction x 20 reps bil with L3 band Standing hip extension x 20 reps bil with L3 band   Modalities IFC to Rt low back/SIJ area x 15 min with mat level/standing exercises noted above  11/25/22 TherEx NuStep L6 x 6 min Supine single knee to chest 3x15 sec bil Supine piriformis stretch (knee to opp shoulder) 3x15 sec bil Isometric hip abduction/er with belt 15 reps x 5 sec hold Bridges with strap x 15 reps; 5 sec hold   Modalities IFC to Rt low back/SIJ area x 15 min with supine exercises noted above  TherAct Trial of heel lift and insert; pt did not like either in current shoe but unable to get lift below insole.  Still recommended trial of these at home to see if it helps her pain.  Amb within clinic with both.  11/20/22 TherEx NuStep L5 x 6 min Standing lumbar extension x10 reps; 1-2 sec hold Standing hip hike x 10 reps bil; 2 sec hold Bridges x 10 reps; 2 sec hold LTR x 10 reps bil; 5  sec hold Single limb clamshell in hooklying x10 reps bil; L4 band Single knee to chest 3x15 sec bil Supine piriformis stretch (knee to opp shoulder) 3x15 sec bil Prone hip extension x10 reps bil; 5 sec hold Scapular retraction 10 x 5 sec hold; L3 band   Eval   Objective measures/appropriate education  TherEx  Bridges x10 Lumbar rotation stretch x10 Tried prone press ups, limited by UE strength Standing lumbar extensions x10  Hip hikes x10 B     PATIENT EDUCATION:  Education details: exam findings, POC, HEP, 11/28/22: DN hand out provided  Person educated: Patient Education method: Explanation and Demonstration Education comprehension: verbalized understanding and returned demonstration  HOME EXERCISE PROGRAM:   Access Code: [email or web address] URL: [email or web address] Date: 11/18/2022 Prepared by: Deniece Ree  Exercises - Supine Bridge  - 2 x daily - 7 x weekly - 1 sets - 10 reps - 2 hold -  Supine Lower Trunk Rotation  - 2 x daily - 7 x weekly - 1 sets - 10 reps - 5 hold - Standing Lumbar Extension  - 2 x daily - 7 x weekly - 1 sets - 10 reps - 1 hold - Standing Hip Hiking  - 2 x daily - 7 x weekly - 1 sets - 10 reps - 2 hold  ASSESSMENT:  CLINICAL IMPRESSION: Pt reporting no pain after last session and feels estim has been very helpful.  Recommended purchasing home unit.  Will continue to benefit from PT to maximize function.  OBJECTIVE IMPAIRMENTS: decreased ROM, decreased strength, increased fascial restrictions, increased muscle spasms, impaired flexibility, postural dysfunction, and pain.   ACTIVITY LIMITATIONS: bending, sitting, squatting, transfers, and locomotion level  PARTICIPATION LIMITATIONS: meal prep, cleaning, laundry, driving, shopping, community activity, and yard work  PERSONAL FACTORS: Age, Behavior pattern, Fitness, Past/current experiences, Social background, and Time since onset of injury/illness/exacerbation are also affecting  patient's functional outcome.   REHAB POTENTIAL: Good  CLINICAL DECISION MAKING: Stable/uncomplicated  EVALUATION COMPLEXITY: Low   GOALS: Goals reviewed with patient? Yes  SHORT TERM GOALS: Target date: 12/09/2022    Will be compliant with appropriate progressive HEP  Baseline: Goal status: MET 12/03/22  2.  Will demonstrate better awareness of general posture as well as correct biomechanics for supine<->sit transfers and floor to waist heigh lifting  Baseline:  Goal status: MET 12/03/22  3.  Lumbar ROM to have normalized with no limitations  Baseline:  Goal status: on-going 12/03/22  4.  Flexibility impairments to have resolved  Baseline:  Goal status: on-going 12/03/22   LONG TERM GOALS: Target date: 12/30/2022    MMT to have improved by at least 1 grade in all weak muscles  Baseline:  Goal status: INITIAL  2.  Pain to be no more than 1/10 at worst/frequency of pain to have improved by at least 75%  Baseline:  Goal status: INITIAL  3.  FOTO score to have improved by at least 10 points to show subjective functional improvement  Baseline:  Goal status: INITIAL  4.  Will be compliant in appropriate gym based exercise program to maintain functional gains from PT/prevent recurrence of pain  Baseline:  Goal status: INITIAL   PLAN:  PT FREQUENCY: 2x/week  PT DURATION: 6 weeks  PLANNED INTERVENTIONS: Therapeutic exercises, Therapeutic activity, Neuromuscular re-education, Balance training, Gait training, Patient/Family education, Self Care, Joint mobilization, Aquatic Therapy, Dry Needling, Electrical stimulation, Spinal mobilization, Cryotherapy, Moist heat, Taping, Ultrasound, Ionotophoresis 4mg /ml Dexamethasone, Manual therapy, and Re-evaluation.  PLAN FOR NEXT SESSION: teach how to use TENS unit if she has it,  SI stab, IFC PRN,  hip and core strength, focus on extension based program     , PT, DPT 12/06/22 9:25 AM

## 2022-12-10 ENCOUNTER — Ambulatory Visit: Payer: Medicare Other | Admitting: Physical Therapy

## 2022-12-10 ENCOUNTER — Encounter: Payer: Self-pay | Admitting: Physical Therapy

## 2022-12-10 DIAGNOSIS — M6281 Muscle weakness (generalized): Secondary | ICD-10-CM | POA: Diagnosis not present

## 2022-12-10 DIAGNOSIS — M5459 Other low back pain: Secondary | ICD-10-CM | POA: Diagnosis not present

## 2022-12-10 DIAGNOSIS — R293 Abnormal posture: Secondary | ICD-10-CM | POA: Diagnosis not present

## 2022-12-10 NOTE — Therapy (Signed)
OUTPATIENT PHYSICAL THERAPY TREATMENT NOTE   Patient Name: Gabrielle Beltran MRN: [id number] DOB:1946/10/15, 77 y.o., female Today's Date: 12/10/2022  END OF SESSION:   PT End of Session - 12/10/22 0903     Visit Number 7    Number of Visits 13    Date for PT Re-Evaluation 12/30/22    Authorization Type [employer] MCR    Progress Note Due on Visit 10    PT Start Time 0845    PT Stop Time 0927    PT Time Calculation (min) 42 min    Activity Tolerance Patient tolerated treatment well    Behavior During Therapy [facility] for tasks assessed/performed                 History reviewed. No pertinent past medical history. History reviewed. No pertinent surgical history. Patient Active Problem List   Diagnosis Date Noted   HYPERLIPIDEMIA 05/07/2007   URINARY INCONTINENCE 05/07/2007     THERAPY DIAG:  Other low back pain  Muscle weakness (generalized)  Abnormal posture   PCP: Maurice Small MD   REFERRING PROVIDER: London Sheer, MD  REFERRING DIAG: M54.50 (ICD-10-CM) - Low back pain, unspecified back pain laterality, unspecified chronicity, unspecified whether sciatica present  Rationale for Evaluation and Treatment: Rehabilitation  EVAL THERAPY DIAG:  Other low back pain  Muscle weakness (generalized)  Abnormal posture  ONSET DATE: 11/08/2022  SUBJECTIVE:                                                                                                                                                                                           SUBJECTIVE STATEMENT: Having a little pain, bought TENS unit and would like some assistance with setting up  PERTINENT HISTORY:  Orthopedic Spine Surgery Office Note   Assessment: Patient is a 77 y.o. female with right sided low back pain, possible SI joint as etiology versus degenerative disc disease     Plan: -Explained that initially conservative treatment is tried as a significant number of patients may experience relief with  these treatment modalities. Discussed that the conservative treatments include:             -activity modification             -physical therapy             -over the counter pain medications             -medrol dosepak             -steroid injections -Patient has tried activity modification  -Recommended PT (referral provided), home exercises, and tylenol (650mg  TID) -Patient should return  to office in 6 weeks, x-rays at next visit: AP pelvis/inlet/outlet     History:   Patient is a 77 y.o. female who presents today for lumbar spine. Patient had right sided low back pain about 15 years ago that is very similar to what she is experiencing now. She said she received one steroid injection at that time and the pain went away. Pain returned about 3 weeks ago. There was no trauma or injury that brought on the pain. Pain is felt over the right lower back near the SI joint. It is worse with she is standing for awhile or walking. Gets better if she rests. She has tried modifying her activities and resting but it has not gotten better. She not tried any other treatments. No pain radiating down the legs. No paresthesias or numbness.       PAIN:  Are you having pain? Currently 0/10 pain in right sided low back Pain location: R low back but sometimes L and both sides but R>L Pain description: Uncomfortable Aggravating factors: sitting, slouching  Relieving factors: standing   PRECAUTIONS: None  WEIGHT BEARING RESTRICTIONS: No  FALLS:  Has patient fallen in last 6 months? No  LIVING ENVIRONMENT: Lives with: lives with their family Lives in: House/apartment Stairs: no steps, ranch style house Has following equipment at home: None  OCCUPATION: home maker  PLOF: Independent, Independent with basic ADLs, Independent with gait, and Independent with transfers  PATIENT GOALS: get rid of pain  NEXT MD VISIT: Dr. Laurance Flatten 12/20/22   OBJECTIVE:   PATIENT SURVEYS:  EVAL: FOTO 57, predicted  72  MUSCLE LENGTH: EVAL: HS mild tightness B Piriformis WNL B   POSTURE: rounded shoulders and forward head  PALPATION: EVAL: Lumbar parapsinals tight but not TTP  LUMBAR ROM:   AROM eval 12/03/22  Flexion WNL; RFIS small increase in pain   WFL, still pain reported   Extension Mild limitation, REIS improvement in pain  Slight increase in pain with extension attempt  Right lateral flexion Mild limitation   Left lateral flexion Moderate limitation    Right rotation Mild limitation    Left rotation Mild limitation     (Blank rows = not tested)   LOWER EXTREMITY MMT:    MMT Right eval Left eval  Hip flexion 3 3  Hip extension 3 3  Hip abduction 3 3  Hip adduction    Hip internal rotation    Hip external rotation    Knee flexion 4 4-  Knee extension 5 4  Ankle dorsiflexion 5 5  Ankle plantarflexion    Ankle inversion    Ankle eversion     (Blank rows = not tested)  LUMBAR SPECIAL TESTS:  EVAL: Straight leg raise test: Negative  FUNCTIONAL TESTS:  EVAL: LLE slightly shorter than R by about 1/4 inch   GAIT: Distance walked: in clinic distances  Assistive device utilized: None Level of assistance: Complete Independence Comments: + trendelenburg   TODAY'S TREATMENT:  DATE:  12/10/22 Modalities Instructed in home TENS unit set up including electrode placement and precautions, modes/parameters with pt verbalizing understanding. TENS throughout remainder of session with exercises  TherEx NuStep L6 x 6 min Standing hip extension 2x10 bil Standing hip abduction 2x10 bil Dead lifts with 5# KB 2x10 Squats with 5# KB 2x10 SKTC 3x30 sec bil DKTC 3x30 sec Lower trunk rotation 10 x 10 sec hold bil   12/06/22 TherEx NuStep L6 x 6 min Standing hip extension 2x10 bil Standing hip abduction 2x10 bil With E-Stim: Supine single knee to chest 3x30  sec bil Supine piriformis stretch (knee to opp shoulder) 3x30 sec bil Bridges 2x10; 3 sec hold Hooklying clamshells L4 band 2x10  Modalities IFC to low back x 15 min with moist heat and exercises performed above - provided information on where to purchase TENS unit from home   12/03/22 TherEx Supine single knee to chest 3x15 sec bil Supine piriformis stretch (knee to opp shoulder) 3x15 sec bil Quadraped: child's pose x 4 holding 30 seconds Quadraped: cat/camel x 10 holding 5 seconds Standing hip abduction: x 15 each LE Standing hip extension: x 15 each LE Modalities IFC to Rt low back/SIJ area x 10 min with moist heat Manual: Skilled palpation and soft tissue mobs to lumbar paraspinals and Rt QL Trigger Point Dry-Needling  Treatment instructions: Expect mild to moderate muscle soreness. S/S of pneumothorax if dry needled over a lung field, and to seek immediate medical attention should they occur. Patient verbalized understanding of these instructions and education.  Patient Consent Given: Yes Education handout provided: Yes Muscles treated: lumbar paraspinals on Rt Electrical stimulation performed: No Parameters: N/A Treatment response/outcome: twitch response noted   PATIENT EDUCATION:  Education details: exam findings, POC, HEP, 11/28/22: DN hand out provided  Person educated: Patient Education method: Explanation and Demonstration Education comprehension: verbalized understanding and returned demonstration  HOME EXERCISE PROGRAM:   Access Code: [email or web address] URL: [email or web address] Date: 11/18/2022 Prepared by: Deniece Ree  Exercises - Supine Bridge  - 2 x daily - 7 x weekly - 1 sets - 10 reps - 2 hold - Supine Lower Trunk Rotation  - 2 x daily - 7 x weekly - 1 sets - 10 reps - 5 hold - Standing Lumbar Extension  - 2 x daily - 7 x weekly - 1 sets - 10 reps - 1 hold - Standing Hip Hiking  - 2 x daily - 7 x weekly - 1 sets - 10 reps - 2  hold  ASSESSMENT:  CLINICAL IMPRESSION: Pt tolerated session well today, and able to demonstrate understanding of home TENS unit.  Will continue to benefit from PT to maximize function.  OBJECTIVE IMPAIRMENTS: decreased ROM, decreased strength, increased fascial restrictions, increased muscle spasms, impaired flexibility, postural dysfunction, and pain.   ACTIVITY LIMITATIONS: bending, sitting, squatting, transfers, and locomotion level  PARTICIPATION LIMITATIONS: meal prep, cleaning, laundry, driving, shopping, community activity, and yard work  PERSONAL FACTORS: Age, Behavior pattern, Fitness, Past/current experiences, Social background, and Time since onset of injury/illness/exacerbation are also affecting patient's functional outcome.   REHAB POTENTIAL: Good  CLINICAL DECISION MAKING: Stable/uncomplicated  EVALUATION COMPLEXITY: Low   GOALS: Goals reviewed with patient? Yes  SHORT TERM GOALS: Target date: 12/09/2022    Will be compliant with appropriate progressive HEP  Baseline: Goal status: MET 12/03/22  2.  Will demonstrate better awareness of general posture as well as correct biomechanics for supine<->sit transfers and floor to waist heigh lifting  Baseline:  Goal  status: MET 12/03/22  3.  Lumbar ROM to have normalized with no limitations  Baseline:  Goal status: on-going 12/03/22  4.  Flexibility impairments to have resolved  Baseline:  Goal status: on-going 12/03/22   LONG TERM GOALS: Target date: 12/30/2022    MMT to have improved by at least 1 grade in all weak muscles  Baseline:  Goal status: INITIAL  2.  Pain to be no more than 1/10 at worst/frequency of pain to have improved by at least 75%  Baseline:  Goal status: INITIAL  3.  FOTO score to have improved by at least 10 points to show subjective functional improvement  Baseline:  Goal status: INITIAL  4.  Will be compliant in appropriate gym based exercise program to maintain functional gains from  PT/prevent recurrence of pain  Baseline:  Goal status: INITIAL   PLAN:  PT FREQUENCY: 2x/week  PT DURATION: 6 weeks  PLANNED INTERVENTIONS: Therapeutic exercises, Therapeutic activity, Neuromuscular re-education, Balance training, Gait training, Patient/Family education, Self Care, Joint mobilization, Aquatic Therapy, Dry Needling, Electrical stimulation, Spinal mobilization, Cryotherapy, Moist heat, Taping, Ultrasound, Ionotophoresis 4mg /ml Dexamethasone, Manual therapy, and Re-evaluation.  PLAN FOR NEXT SESSION: manual PRN, SI stab, IFC PRN,  hip and core strength, focus on extension based program     Laureen Abrahams, PT, DPT 12/10/22 9:41 AM

## 2022-12-12 ENCOUNTER — Ambulatory Visit: Payer: Medicare Other | Admitting: Physical Therapy

## 2022-12-12 DIAGNOSIS — M5459 Other low back pain: Secondary | ICD-10-CM | POA: Diagnosis not present

## 2022-12-12 DIAGNOSIS — M6281 Muscle weakness (generalized): Secondary | ICD-10-CM

## 2022-12-12 DIAGNOSIS — R293 Abnormal posture: Secondary | ICD-10-CM | POA: Diagnosis not present

## 2022-12-12 NOTE — Therapy (Signed)
OUTPATIENT PHYSICAL THERAPY TREATMENT NOTE   Patient Name: Gabrielle Beltran MRN: [id number] DOB:24-Dec-1945, 77 y.o., female Today's Date: 12/12/2022  END OF SESSION:   PT End of Session - 12/12/22 0926     Visit Number 8    Date for PT Re-Evaluation 12/30/22    Authorization Type [employer] MCR    Progress Note Due on Visit 10    PT Start Time 0845    PT Stop Time 0923    PT Time Calculation (min) 38 min    Activity Tolerance Patient tolerated treatment well    Behavior During Therapy [facility] for tasks assessed/performed                  No past medical history on file. No past surgical history on file. Patient Active Problem List   Diagnosis Date Noted   HYPERLIPIDEMIA 05/07/2007   URINARY INCONTINENCE 05/07/2007     THERAPY DIAG:  Other low back pain  Muscle weakness (generalized)  Abnormal posture   PCP: Kelton Pillar MD   REFERRING PROVIDER: Callie Fielding, MD  REFERRING DIAG: M54.50 (ICD-10-CM) - Low back pain, unspecified back pain laterality, unspecified chronicity, unspecified whether sciatica present  Rationale for Evaluation and Treatment: Rehabilitation  EVAL THERAPY DIAG:  Other low back pain  Muscle weakness (generalized)  Abnormal posture  ONSET DATE: 11/08/2022  SUBJECTIVE:                                                                                                                                                                                           SUBJECTIVE STATEMENT: No pain, feels 70-75% better overall  PERTINENT HISTORY:  Orthopedic Spine Surgery Office Note   Assessment: Patient is a 77 y.o. female with right sided low back pain, possible SI joint as etiology versus degenerative disc disease     Plan: -Explained that initially conservative treatment is tried as a significant number of patients may experience relief with these treatment modalities. Discussed that the conservative treatments include:             -activity  modification             -physical therapy             -over the counter pain medications             -medrol dosepak             -steroid injections -Patient has tried activity modification  -Recommended PT (referral provided), home exercises, and tylenol (650mg  TID) -Patient should return to office in 6 weeks, x-rays at next visit: AP pelvis/inlet/outlet     History:  Patient is a 77 y.o. female who presents today for lumbar spine. Patient had right sided low back pain about 15 years ago that is very similar to what she is experiencing now. She said she received one steroid injection at that time and the pain went away. Pain returned about 3 weeks ago. There was no trauma or injury that brought on the pain. Pain is felt over the right lower back near the SI joint. It is worse with she is standing for awhile or walking. Gets better if she rests. She has tried modifying her activities and resting but it has not gotten better. She not tried any other treatments. No pain radiating down the legs. No paresthesias or numbness.       PAIN:  Are you having pain? Currently 0/10 pain in right sided low back Pain location: R low back but sometimes L and both sides but R>L Pain description: Uncomfortable Aggravating factors: sitting, slouching  Relieving factors: standing   PRECAUTIONS: None  WEIGHT BEARING RESTRICTIONS: No  FALLS:  Has patient fallen in last 6 months? No  LIVING ENVIRONMENT: Lives with: lives with their family Lives in: House/apartment Stairs: no steps, ranch style house Has following equipment at home: None  OCCUPATION: home maker  PLOF: Independent, Independent with basic ADLs, Independent with gait, and Independent with transfers  PATIENT GOALS: get rid of pain  NEXT MD VISIT: Dr. Christell Constant 12/20/22   OBJECTIVE:   PATIENT SURVEYS:  EVAL: FOTO 57, predicted 72 12/12/22: FOTO 60  MUSCLE LENGTH: EVAL: HS mild tightness B Piriformis WNL B   POSTURE: rounded  shoulders and forward head  PALPATION: EVAL: Lumbar parapsinals tight but not TTP  LUMBAR ROM:   AROM eval 12/03/22 12/12/22  Flexion WNL; RFIS small increase in pain   WFL, still pain reported  WNL; no pain  Extension Mild limitation, REIS improvement in pain  Slight increase in pain with extension attempt WNL; no pain  Right lateral flexion Mild limitation  WNL  Left lateral flexion Moderate limitation   WNL  Right rotation Mild limitation   WNL  Left rotation Mild limitation   WNL   (Blank rows = not tested)   LOWER EXTREMITY MMT:    MMT Right eval Left eval Rt/Lt 12/12/22  Hip flexion 3 3 4/4  Hip extension 3 3 4/4/  Hip abduction 3 3 4/4  Hip adduction     Hip internal rotation     Hip external rotation     Knee flexion 4 4- 5/5  Knee extension 5 4 5/5  Ankle dorsiflexion 5 5 5/5  Ankle plantarflexion     Ankle inversion     Ankle eversion      (Blank rows = not tested)  LUMBAR SPECIAL TESTS:  EVAL: Straight leg raise test: Negative  FUNCTIONAL TESTS:  EVAL: LLE slightly shorter than R by about 1/4 inch   GAIT: Distance walked: in clinic distances  Assistive device utilized: None Level of assistance: Complete Independence Comments: + trendelenburg   TODAY'S TREATMENT:  DATE:  12/12/22 Spent ~ 10 min helping with new TENS unit and electrodes, difficulty with removing wires from electrodes, and recommended different style of electrodes and possibly new TENS unit.  Discussed current progress with goal assement performed - see above for details.  Recommended she continue with current HEP as well as gym program (provided today) to continue to maintain gains made in PT.  Pt verbalized understanding.  12/10/22 Modalities Instructed in home TENS unit set up including electrode placement and precautions, modes/parameters with pt verbalizing  understanding. TENS throughout remainder of session with exercises  TherEx NuStep L6 x 6 min Standing hip extension 2x10 bil Standing hip abduction 2x10 bil Dead lifts with 5# KB 2x10 Squats with 5# KB 2x10 SKTC 3x30 sec bil DKTC 3x30 sec Lower trunk rotation 10 x 10 sec hold bil   12/06/22 TherEx NuStep L6 x 6 min Standing hip extension 2x10 bil Standing hip abduction 2x10 bil With E-Stim: Supine single knee to chest 3x30 sec bil Supine piriformis stretch (knee to opp shoulder) 3x30 sec bil Bridges 2x10; 3 sec hold Hooklying clamshells L4 band 2x10  Modalities IFC to low back x 15 min with moist heat and exercises performed above - provided information on where to purchase TENS unit from home   12/03/22 TherEx Supine single knee to chest 3x15 sec bil Supine piriformis stretch (knee to opp shoulder) 3x15 sec bil Quadraped: child's pose x 4 holding 30 seconds Quadraped: cat/camel x 10 holding 5 seconds Standing hip abduction: x 15 each LE Standing hip extension: x 15 each LE Modalities IFC to Rt low back/SIJ area x 10 min with moist heat Manual: Skilled palpation and soft tissue mobs to lumbar paraspinals and Rt QL Trigger Point Dry-Needling  Treatment instructions: Expect mild to moderate muscle soreness. S/S of pneumothorax if dry needled over a lung field, and to seek immediate medical attention should they occur. Patient verbalized understanding of these instructions and education.  Patient Consent Given: Yes Education handout provided: Yes Muscles treated: lumbar paraspinals on Rt Electrical stimulation performed: No Parameters: N/A Treatment response/outcome: twitch response noted   PATIENT EDUCATION:  Education details: exam findings, POC, HEP, 11/28/22: DN hand out provided  Person educated: Patient Education method: Explanation and Demonstration Education comprehension: verbalized understanding and returned demonstration  HOME EXERCISE PROGRAM: Access  Code: [email or web address] URL: [email or web address] Date: 12/12/2022 Prepared by: Faustino Congress  Exercises - Supine Bridge  - 2 x daily - 7 x weekly - 1 sets - 10 reps - 2 hold - Supine Lower Trunk Rotation  - 2 x daily - 7 x weekly - 1 sets - 10 reps - 5 hold - Standing Lumbar Extension  - 2 x daily - 7 x weekly - 1 sets - 10 reps - 1 hold - Standing Hip Hiking  - 2 x daily - 7 x weekly - 1 sets - 10 reps - 2 hold - Half Deadlift with Kettlebell  - 1 x daily - 7 x weekly - 3 sets - 10 reps - Goblet Squat with Kettlebell  - 1 x daily - 7 x weekly - 3 sets - 10 reps - Hip Abduction Machine  - 1 x daily - 7 x weekly - 3 sets - 10 reps - Full Leg Press  - 1 x daily - 7 x weekly - 3 sets - 10 reps  ASSESSMENT:  CLINICAL IMPRESSION: Pt has met all LTGs except FOTO goal at this time.  Recommended continued HEP and regular gym attendance  to maintain progress with PT.  Pt in agreement and will d/c PT today.  OBJECTIVE IMPAIRMENTS: decreased ROM, decreased strength, increased fascial restrictions, increased muscle spasms, impaired flexibility, postural dysfunction, and pain.   ACTIVITY LIMITATIONS: bending, sitting, squatting, transfers, and locomotion level  PARTICIPATION LIMITATIONS: meal prep, cleaning, laundry, driving, shopping, community activity, and yard work  PERSONAL FACTORS: Age, Behavior pattern, Fitness, Past/current experiences, Social background, and Time since onset of injury/illness/exacerbation are also affecting patient's functional outcome.   REHAB POTENTIAL: Good  CLINICAL DECISION MAKING: Stable/uncomplicated  EVALUATION COMPLEXITY: Low   GOALS: Goals reviewed with patient? Yes  SHORT TERM GOALS: Target date: 12/09/2022    Will be compliant with appropriate progressive HEP  Baseline: Goal status: MET 12/03/22  2.  Will demonstrate better awareness of general posture as well as correct biomechanics for supine<->sit transfers and floor to waist heigh  lifting  Baseline:  Goal status: MET 12/03/22  3.  Lumbar ROM to have normalized with no limitations  Baseline:  Goal status: MET 12/12/22  4.  Flexibility impairments to have resolved  Baseline:  Goal status: MET 12/12/22   LONG TERM GOALS: Target date: 12/30/2022    MMT to have improved by at least 1 grade in all weak muscles  Baseline:  Goal status: MET 12/12/22  2.  Pain to be no more than 1/10 at worst/frequency of pain to have improved by at least 75%  Baseline:  Goal status: MET 12/12/22  3.  FOTO score to have improved by at least 10 points to show subjective functional improvement  Baseline:  Goal status: NOT MET 12/12/22  4.  Will be compliant in appropriate gym based exercise program to maintain functional gains from PT/prevent recurrence of pain  Baseline:  Goal status: MET 12/12/22   PLAN:  PT FREQUENCY: 2x/week  PT DURATION: 6 weeks  PLANNED INTERVENTIONS: Therapeutic exercises, Therapeutic activity, Neuromuscular re-education, Balance training, Gait training, Patient/Family education, Self Care, Joint mobilization, Aquatic Therapy, Dry Needling, Electrical stimulation, Spinal mobilization, Cryotherapy, Moist heat, Taping, Ultrasound, Ionotophoresis 4mg /ml Dexamethasone, Manual therapy, and Re-evaluation.  PLAN FOR NEXT SESSION: d/c PT    Laureen Abrahams, PT, DPT 12/12/22 9:27 AM     PHYSICAL THERAPY DISCHARGE SUMMARY  Visits from Start of Care: 8  Current functional level related to goals / functional outcomes: See above   Remaining deficits: See above   Education / Equipment: HEP   Patient agrees to discharge. Patient goals were partially met. Patient is being discharged due to being pleased with the current functional level.   Laureen Abrahams, PT, DPT 12/12/22 9:27 AM  [facility] [address] [place], [place], [address] Phone: [phone]   Fax:  [phone]

## 2022-12-19 ENCOUNTER — Ambulatory Visit: Payer: Medicare Other | Admitting: Orthopedic Surgery

## 2022-12-19 ENCOUNTER — Ambulatory Visit (INDEPENDENT_AMBULATORY_CARE_PROVIDER_SITE_OTHER): Payer: Medicare Other

## 2022-12-19 ENCOUNTER — Encounter: Payer: Self-pay | Admitting: Orthopedic Surgery

## 2022-12-19 VITALS — BP 118/71 | HR 65

## 2022-12-19 DIAGNOSIS — M545 Low back pain, unspecified: Secondary | ICD-10-CM

## 2022-12-19 NOTE — Progress Notes (Signed)
Orthopedic Spine Surgery Office Note  Assessment: Patient is a 77 y.o. female with right sided low back pain, possible SI joint as etiology. Improved with conservative treatment    Plan: -Explained that initially conservative treatment is tried as a significant number of patients may experience relief with these treatment modalities. Discussed that the conservative treatments include:  -activity modification  -physical therapy  -over the counter pain medications  -medrol dosepak  -steroid injection -Patient has tried Physical therapy, home exercise, Tylenol -Recommended continue with home exercise program and the exercises done with PT. She should continue to use tylenol as needed -If pain comes back, would try an SI joint injection next with Dr. Rolena Infante -Patient should return to office on an as needed basis   Patient expressed understanding of the plan and all questions were answered to the patient's satisfaction.   ___________________________________________________________________________  History: Patient is a 77 y.o. female who has been previously seen in the office for right-sided low back pain.  At her last visit, I recommended that she try physical therapy and home exercise program.  I told her also to use Tylenol.  Since the last visit, her pain has improved significantly. She still has pain in the right SI joint region but it is not as severe. She feels it mostly when changing positions. She gives the example of going from seated to standing position. No pain radiating down either leg. Denies paresthesias and numbness.   Previous treatments: Physical therapy, home exercise, Tylenol  COPY OF FIRST NOTE Patient is a 77 y.o. female who presents today for lumbar spine. Patient had right sided low back pain about 15 years ago that is very similar to what she is experiencing now. She said she received one steroid injection at that time and the pain went away. Pain returned about 3 weeks  ago. There was no trauma or injury that brought on the pain. Pain is felt over the right lower back near the SI joint. It is worse with she is standing for awhile or walking. Gets better if she rests. She has tried modifying her activities and resting but it has not gotten better. She not tried any other treatments. No pain radiating down the legs. No paresthesias or numbness.      Weakness: denies Symptoms of imbalance: denies Paresthesias and numbness: denies Bowel or bladder incontinence: denies Saddle anesthesia: denies END OF COPY  Physical Exam:  General: no acute distress, appears stated age Neurologic: alert, answering questions appropriately, following commands Respiratory: unlabored breathing on room air, symmetric chest rise Psychiatric: appropriate affect, normal cadence to speech   MSK (spine):  -Strength exam      Left  Right EHL    5/5  5/5 TA    5/5  5/5 GSC    5/5  5/5 Knee extension  5/5  5/5 Hip flexion   5/5  5/5  -Sensory exam    Sensation intact to light touch in L3-S1 nerve distributions of bilateral lower extremities  -Straight leg raise: negative -Contralateral straight leg raise: negative -Femoral nerve stretch test: negative bilaterally -Clonus: no beats bilaterally  -Left hip exam: No pain to range of motion, negative Stinchfield, negative Faber -Right hip exam: No pain through range of motion, negative stinchfield, minimal pain with FABER, negative SI joint compression test  Imaging: XR of the lumbar spine from 11/08/2022 was previously independently reviewed and interpreted, showing lumbar scoliosis with apex to the right at L3. Disc height loss at L3/4, L4/5, L5/S1. No  fracture or dislocation.    XR of the pelvis from 12/19/2022 was independently reviewed and interpreted, showing no fracture or dislocation. No significant degenerative changes within either hip joint.    Patient name: Gabrielle Beltran Patient MRN: [id number] Date of visit:  12/19/22

## 2022-12-27 DIAGNOSIS — H524 Presbyopia: Secondary | ICD-10-CM | POA: Diagnosis not present

## 2022-12-27 DIAGNOSIS — H2513 Age-related nuclear cataract, bilateral: Secondary | ICD-10-CM | POA: Diagnosis not present

## 2022-12-27 DIAGNOSIS — H04123 Dry eye syndrome of bilateral lacrimal glands: Secondary | ICD-10-CM | POA: Diagnosis not present

## 2023-01-27 DIAGNOSIS — Z23 Encounter for immunization: Secondary | ICD-10-CM | POA: Diagnosis not present

## 2023-01-27 DIAGNOSIS — E441 Mild protein-calorie malnutrition: Secondary | ICD-10-CM | POA: Diagnosis not present

## 2023-01-27 DIAGNOSIS — R1013 Epigastric pain: Secondary | ICD-10-CM | POA: Diagnosis not present

## 2023-01-27 DIAGNOSIS — Z0000 Encounter for general adult medical examination without abnormal findings: Secondary | ICD-10-CM | POA: Diagnosis not present

## 2023-01-27 DIAGNOSIS — E559 Vitamin D deficiency, unspecified: Secondary | ICD-10-CM | POA: Diagnosis not present

## 2023-01-27 DIAGNOSIS — Z79899 Other long term (current) drug therapy: Secondary | ICD-10-CM | POA: Diagnosis not present

## 2023-01-27 DIAGNOSIS — R634 Abnormal weight loss: Secondary | ICD-10-CM | POA: Diagnosis not present

## 2023-01-27 DIAGNOSIS — M810 Age-related osteoporosis without current pathological fracture: Secondary | ICD-10-CM | POA: Diagnosis not present

## 2023-01-27 DIAGNOSIS — H04123 Dry eye syndrome of bilateral lacrimal glands: Secondary | ICD-10-CM | POA: Diagnosis not present

## 2023-01-27 DIAGNOSIS — H9319 Tinnitus, unspecified ear: Secondary | ICD-10-CM | POA: Diagnosis not present

## 2023-01-27 DIAGNOSIS — E782 Mixed hyperlipidemia: Secondary | ICD-10-CM | POA: Diagnosis not present

## 2023-01-27 DIAGNOSIS — N3281 Overactive bladder: Secondary | ICD-10-CM | POA: Diagnosis not present

## 2023-02-14 DIAGNOSIS — R1013 Epigastric pain: Secondary | ICD-10-CM | POA: Diagnosis not present

## 2023-03-11 ENCOUNTER — Other Ambulatory Visit: Payer: Self-pay | Admitting: Internal Medicine

## 2023-03-11 DIAGNOSIS — N3281 Overactive bladder: Secondary | ICD-10-CM | POA: Diagnosis not present

## 2023-03-11 DIAGNOSIS — E785 Hyperlipidemia, unspecified: Secondary | ICD-10-CM | POA: Diagnosis not present

## 2023-03-11 DIAGNOSIS — E441 Mild protein-calorie malnutrition: Secondary | ICD-10-CM | POA: Diagnosis not present

## 2023-03-11 DIAGNOSIS — M810 Age-related osteoporosis without current pathological fracture: Secondary | ICD-10-CM

## 2023-03-24 ENCOUNTER — Encounter: Payer: Self-pay | Admitting: Internal Medicine

## 2023-05-13 DIAGNOSIS — H903 Sensorineural hearing loss, bilateral: Secondary | ICD-10-CM | POA: Diagnosis not present

## 2023-06-01 IMAGING — MG MM DIGITAL SCREENING BILAT W/ TOMO AND CAD
6 of 10 series · 6 of 30 positions shown · non-contrast
Comparison: Previous exam(s).

CLINICAL DATA: Screening.

EXAM:
DIGITAL SCREENING BILATERAL MAMMOGRAM WITH TOMOSYNTHESIS AND CAD
TECHNIQUE: Bilateral screening digital craniocaudal and mediolateral oblique
mammograms were obtained. Bilateral screening digital breast
tomosynthesis was performed. The images were evaluated with
computer-aided detection.

[L CC synth-2D]
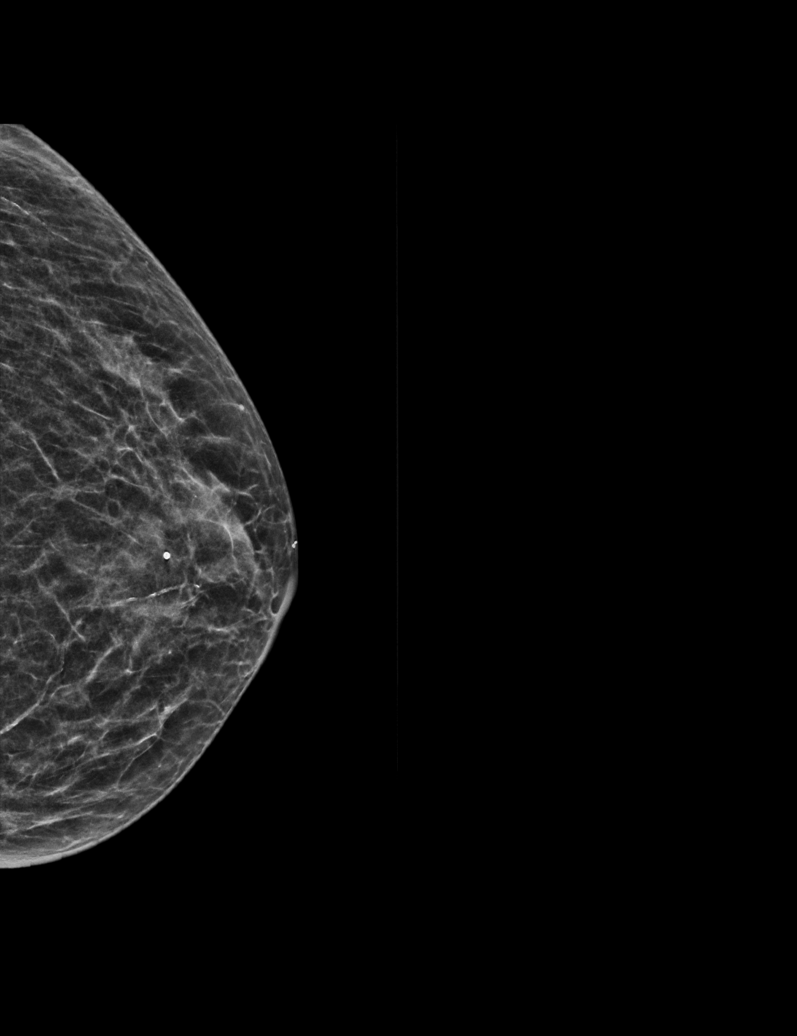

[R MLO synth-2D (1 of 2)]
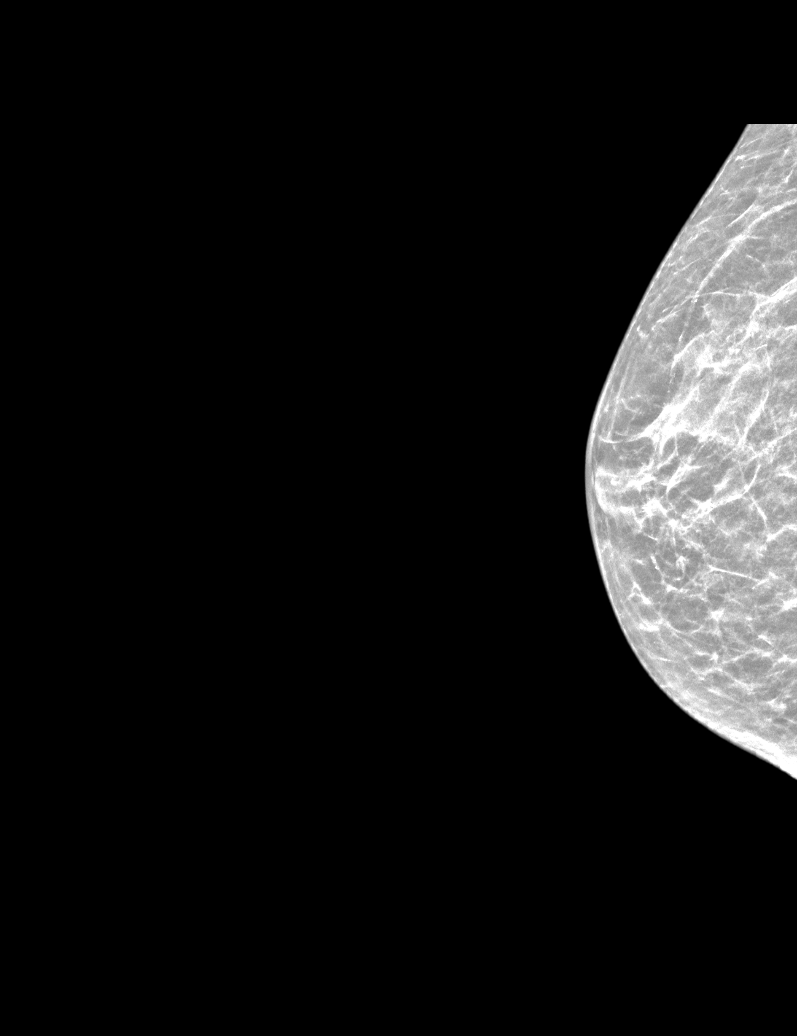

[R MLO synth-2D (2 of 2)]
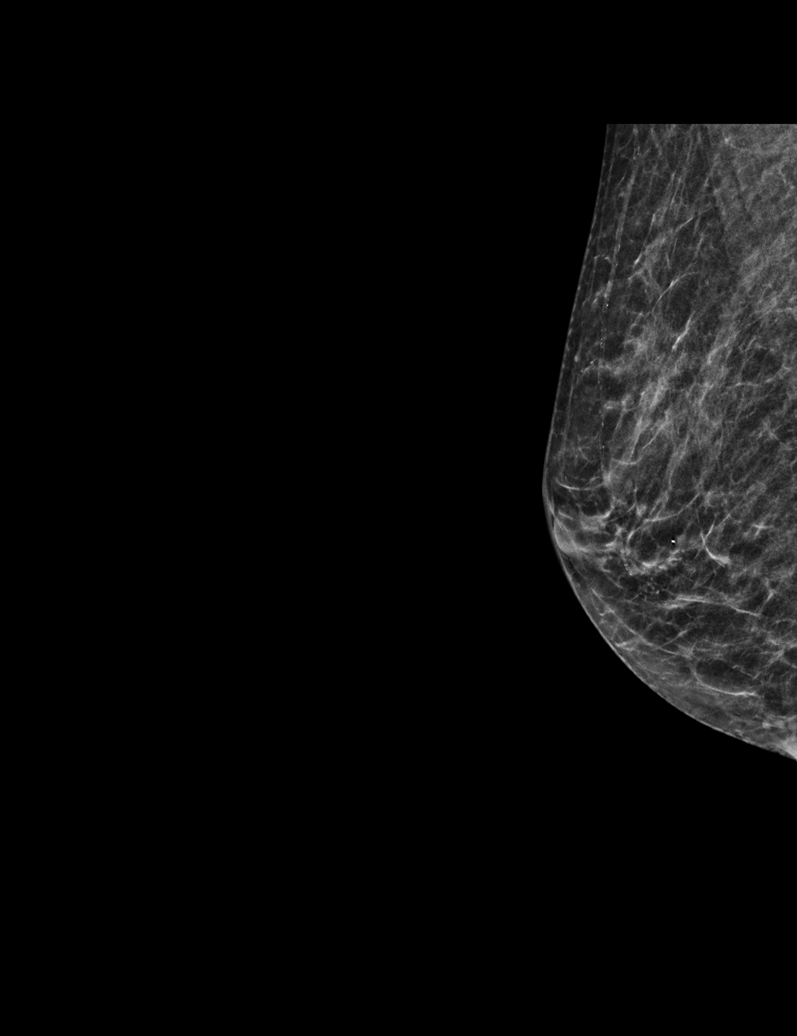

[L MLO synth-2D]
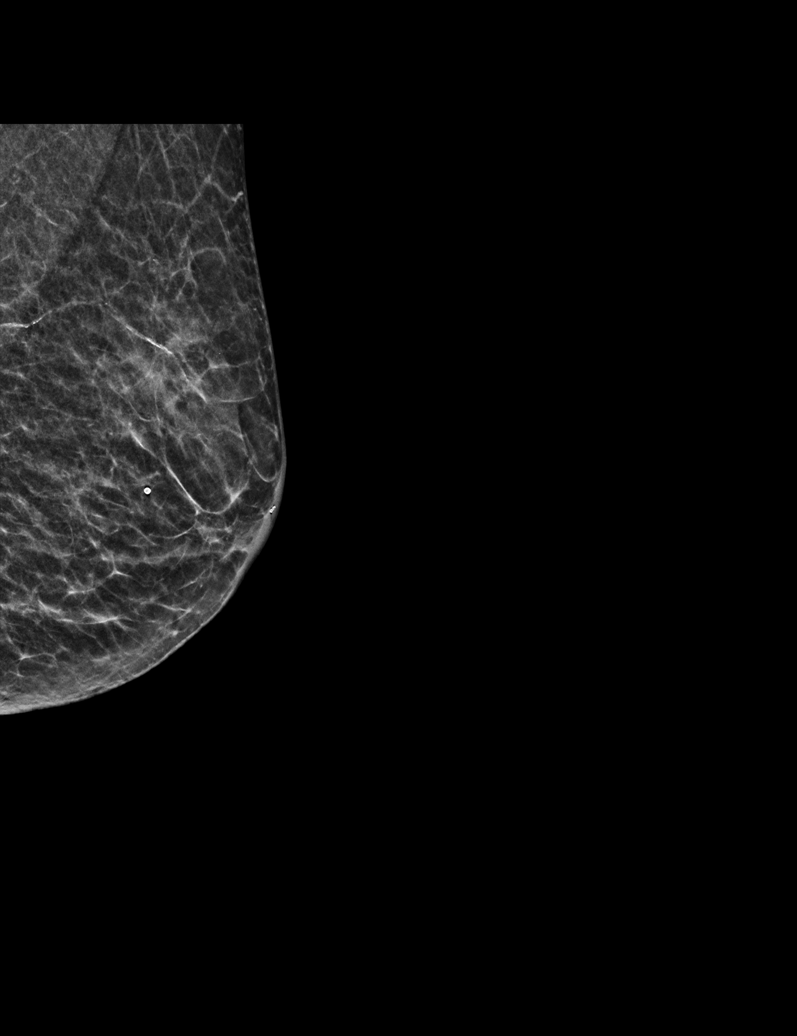

[R CC synth-2D]
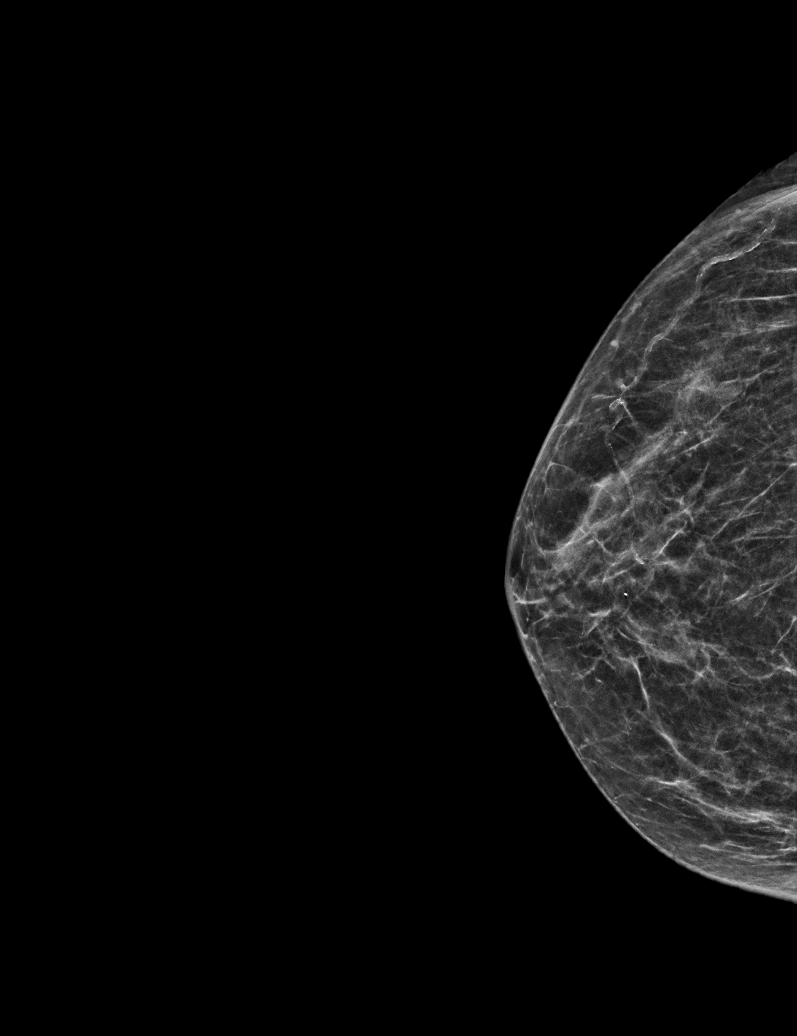

[L CC tomo · tomo slice 19/38.0]
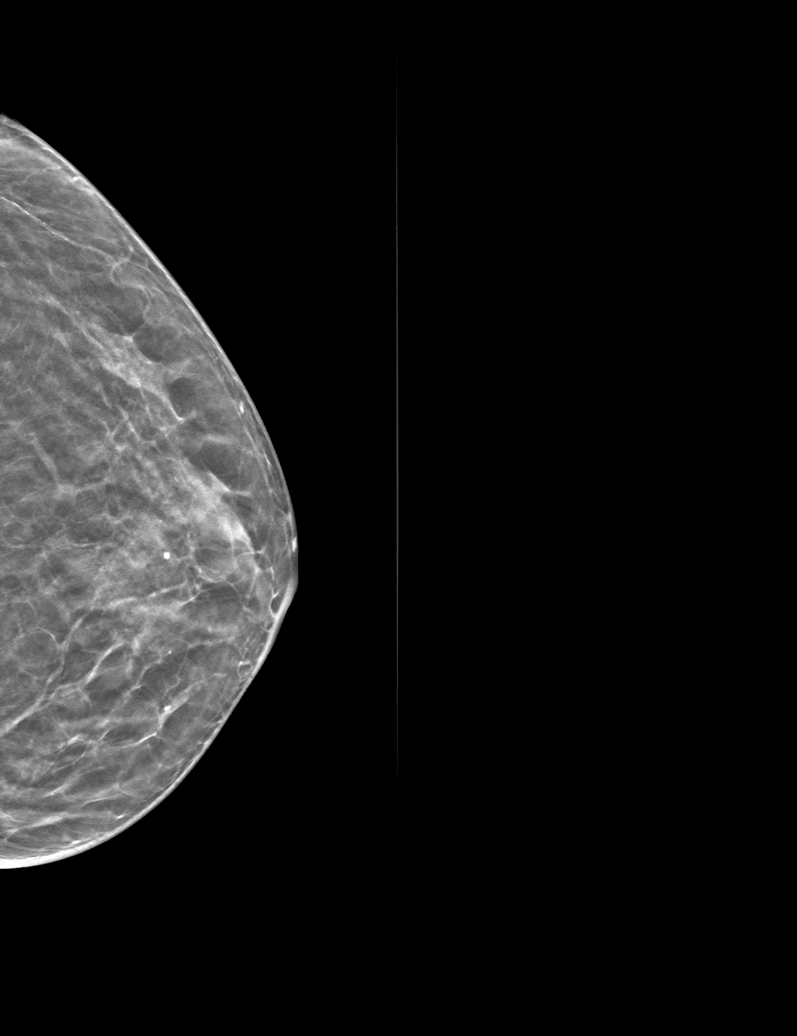

[6 of 30 positions shown; findings below may reference images not displayed]

ACR Breast Density Category c: The breast tissue is heterogeneously
dense, which may obscure small masses.
FINDINGS: There are no findings suspicious for malignancy.
IMPRESSION: No mammographic evidence of malignancy. A result letter of this
screening mammogram will be mailed directly to the patient.

RECOMMENDATION:
Screening mammogram in one year. (Code:Q3-W-BC3)

BI-RADS CATEGORY  1: Negative.

## 2023-07-01 DIAGNOSIS — M810 Age-related osteoporosis without current pathological fracture: Secondary | ICD-10-CM | POA: Diagnosis not present

## 2023-07-01 DIAGNOSIS — M62830 Muscle spasm of back: Secondary | ICD-10-CM | POA: Diagnosis not present

## 2023-07-01 DIAGNOSIS — E46 Unspecified protein-calorie malnutrition: Secondary | ICD-10-CM | POA: Diagnosis not present

## 2023-10-14 ENCOUNTER — Ambulatory Visit
Admission: RE | Admit: 2023-10-14 | Discharge: 2023-10-14 | Disposition: A | Discharge status: Home / Self Care | Payer: Medicare Other | Source: Ambulatory Visit | Attending: Internal Medicine | Admitting: Internal Medicine

## 2023-10-14 DIAGNOSIS — M810 Age-related osteoporosis without current pathological fracture: Secondary | ICD-10-CM

## 2023-10-14 DIAGNOSIS — M8588 Other specified disorders of bone density and structure, other site: Secondary | ICD-10-CM | POA: Diagnosis not present

## 2024-01-02 DIAGNOSIS — H04123 Dry eye syndrome of bilateral lacrimal glands: Secondary | ICD-10-CM | POA: Diagnosis not present

## 2024-01-02 DIAGNOSIS — H2513 Age-related nuclear cataract, bilateral: Secondary | ICD-10-CM | POA: Diagnosis not present

## 2024-01-02 DIAGNOSIS — H524 Presbyopia: Secondary | ICD-10-CM | POA: Diagnosis not present

## 2024-01-30 DIAGNOSIS — Z23 Encounter for immunization: Secondary | ICD-10-CM | POA: Diagnosis not present

## 2024-01-30 DIAGNOSIS — E441 Mild protein-calorie malnutrition: Secondary | ICD-10-CM | POA: Diagnosis not present

## 2024-01-30 DIAGNOSIS — N3281 Overactive bladder: Secondary | ICD-10-CM | POA: Diagnosis not present

## 2024-01-30 DIAGNOSIS — E785 Hyperlipidemia, unspecified: Secondary | ICD-10-CM | POA: Diagnosis not present

## 2024-01-30 DIAGNOSIS — M810 Age-related osteoporosis without current pathological fracture: Secondary | ICD-10-CM | POA: Diagnosis not present

## 2024-01-30 DIAGNOSIS — Z0000 Encounter for general adult medical examination without abnormal findings: Secondary | ICD-10-CM | POA: Diagnosis not present

## 2024-08-05 DIAGNOSIS — N3281 Overactive bladder: Secondary | ICD-10-CM | POA: Diagnosis not present

## 2024-08-05 DIAGNOSIS — E785 Hyperlipidemia, unspecified: Secondary | ICD-10-CM | POA: Diagnosis not present

## 2024-08-05 DIAGNOSIS — Z23 Encounter for immunization: Secondary | ICD-10-CM | POA: Diagnosis not present

## 2024-08-05 DIAGNOSIS — M810 Age-related osteoporosis without current pathological fracture: Secondary | ICD-10-CM | POA: Diagnosis not present

## 2024-08-05 DIAGNOSIS — M549 Dorsalgia, unspecified: Secondary | ICD-10-CM | POA: Diagnosis not present

## 2024-08-05 DIAGNOSIS — E441 Mild protein-calorie malnutrition: Secondary | ICD-10-CM | POA: Diagnosis not present
